# Patient Record
Sex: Female | Born: 2005 | Race: Black or African American | Hispanic: No | Marital: Single
Health system: Southern US, Community
[De-identification: ages and names within clinical notes are randomized; demographics above are authoritative.]

---
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
## 2023-09-03 NOTE — L&D Delivery Note (Signed)
 Kara Short is a 18 y.o. female G1P0 with IUP at [redacted]w[redacted]d admitted for IOL secondary to preeclampsia.  She progressed with augmentation to complete and pushed less than 30 min to deliver viable female.  Cord clamping delayed by 1-2 min then clamped by CNM and cut by her cousin.  Placenta intact and spontaneous, bleeding minimal.  2nd degree laceration repaired without difficulty.  Mom and baby stable prior to transfer to postpartum. She plans on breastfeeding.  Uncertain about birth control, favoring OCPs.  Delivery Note At 6:51 PM a viable and healthy female was delivered via Vaginal, Spontaneous (Presentation: Left Occiput Anterior), loose body cord reduced easily.  APGAR: 9, 9; weight pending.   Placenta status: Spontaneous, Intact.  Cord: 3 vessels with the following complications: None.    Anesthesia: Epidural Episiotomy: None Lacerations: 2nd degree Suture Repair: 3.0 vicryl Est. Blood Loss (mL): 182  Mom to postpartum.  Baby to Couplet care / Skin to Skin.  Lani N Karim-Rhoades 06/19/2024, 7:22 PM

## 2023-10-30 ENCOUNTER — Ambulatory Visit (HOSPITAL_COMMUNITY)
Admission: EM | Admit: 2023-10-30 | Discharge: 2023-10-30 | Disposition: A | Discharge status: Home / Self Care | Payer: Medicaid Other | Attending: Family Medicine | Admitting: Family Medicine

## 2023-10-30 ENCOUNTER — Encounter (HOSPITAL_COMMUNITY): Payer: Self-pay

## 2023-10-30 DIAGNOSIS — Z3201 Encounter for pregnancy test, result positive: Secondary | ICD-10-CM | POA: Diagnosis not present

## 2023-10-30 LAB — POCT URINE PREGNANCY: Preg Test, Ur: POSITIVE — AB

## 2023-10-30 MED ORDER — PRENATAL COMPLETE 14-0.4 MG PO TABS: ORAL_TABLET | ORAL | 0 refills | Status: DC

## 2023-10-30 NOTE — ED Triage Notes (Signed)
 Pt states has had 2 positive home pregnancy test. Last [facility] 1/23. Denies sx's

## 2023-10-30 NOTE — ED Provider Notes (Signed)
  [facility] CARE CENTER   [id number] 10/30/23 Arrival Time: 1011  ASSESSMENT & PLAN:  1. Positive pregnancy test    Results for orders placed or performed during the hospital encounter of 10/30/23  POCT urine pregnancy   Collection Time: 10/30/23 11:29 AM  Result Value Ref Range   Preg Test, Ur Positive (A) Negative  Copy of test given. Without n/v/abd pain.  Meds ordered this encounter  Medications   Prenatal Vit-Fe Fumarate-FA (PRENATAL COMPLETE) 14-0.4 MG TABS    Sig: Take one tablet by mouth once daily.    Dispense:  90 tablet    Refill:  0     Follow-up Information     Schedule an appointment as soon as possible for a visit  with Center for [facility] at [facility].   Specialty: Obstetrics and Gynecology Contact information: [address] [place] [address] [phone]                Reviewed expectations re: course of current medical issues. Questions answered. Outlined signs and symptoms indicating need for more acute intervention. Understanding verbalized. After Visit Summary given.   SUBJECTIVE: History from: Patient. Kara Short is a 18 y.o. female. Pt states has had 2 positive home pregnancy test. Last [facility] 1/23. Denies sx's Normal PO intake without n/v/d.  OBJECTIVE:  Vitals:   10/30/23 1119  BP: 111/69  Pulse: 86  Resp: 18  Temp: 98.4 F (36.9 C)  TempSrc: Oral  SpO2: 98%    General appearance: alert; no distress Abd: benign Extremities: no edema Skin: warm and dry Neurologic: normal gait Psychological: alert and cooperative; normal mood and affect  Labs: Results for orders placed or performed during the hospital encounter of 10/30/23  POCT urine pregnancy   Collection Time: 10/30/23 11:29 AM  Result Value Ref Range   Preg Test, Ur Positive (A) Negative   Labs Reviewed  POCT URINE PREGNANCY - Abnormal; Notable for the following components:      Result Value   Preg Test, Ur  Positive (*)    All other components within normal limits    Imaging: No results found.  No Known Allergies  History reviewed. No pertinent past medical history. Social History   Socioeconomic History   Marital status: Single    Spouse name: Not on file   Number of children: Not on file   Years of education: Not on file   Highest education level: Not on file  Occupational History   Not on file  Tobacco Use   Smoking status: Passive Smoke Exposure - Never Smoker   Smokeless tobacco: Never  Vaping Use   Vaping status: Never Used  Substance and Sexual Activity   Alcohol use: Not Currently   Drug use: Not Currently   Sexual activity: Not on file  Other Topics Concern   Not on file  Social History Narrative   Not on file   Social Drivers of Health   Financial Resource Strain: Not on file  Food Insecurity: Not on file  Transportation Needs: Not on file  Physical Activity: Not on file  Stress: Not on file  Social Connections: Not on file  Intimate Partner Violence: Not on file   History reviewed. No pertinent family history. History reviewed. No pertinent surgical history.   Mardella Layman, MD 10/30/23 1320

## 2023-12-09 ENCOUNTER — Telehealth

## 2023-12-09 DIAGNOSIS — Z3403 Encounter for supervision of normal first pregnancy, third trimester: Secondary | ICD-10-CM | POA: Insufficient documentation

## 2023-12-09 DIAGNOSIS — Z3689 Encounter for other specified antenatal screening: Secondary | ICD-10-CM

## 2023-12-09 DIAGNOSIS — Z3401 Encounter for supervision of normal first pregnancy, first trimester: Secondary | ICD-10-CM | POA: Insufficient documentation

## 2023-12-09 MED ORDER — BLOOD PRESSURE MONITORING DEVI: 1.0000 | 0 refills | Status: DC

## 2023-12-09 NOTE — Patient Instructions (Signed)

## 2023-12-09 NOTE — Progress Notes (Signed)
 New OB Intake  I connected with Kara Short  on 12/09/23 at 10:15 AM EDT by MyChart Video Visit and verified that I am speaking with the correct person using two identifiers. Nurse is located at [facility] and pt is located at home.  I discussed the limitations, risks, security and privacy concerns of performing an evaluation and management service by telephone and the availability of in person appointments. I also discussed with the patient that there may be a patient responsible charge related to this service. The patient expressed understanding and agreed to proceed.  I explained I am completing New OB Intake today. We discussed EDD of Not found.. Pt is G2P0. I reviewed her allergies, medications and Medical/Surgical/OB history.    There are no active problems to display for this patient.   Concerns addressed today  Delivery Plans Plans to deliver at [facility]. Discussed the nature of our practice with multiple providers including residents and students. Due to the size of the practice, the delivering provider may not be the same as those providing prenatal care.   Patient is not interested in water birth. Offered upcoming OB visit with CNM to discuss further.  MyChart/Babyscripts MyChart access verified. I explained pt will have some visits in office and some virtually. Babyscripts instructions given and order placed. Patient verifies receipt of registration text/e-mail. Account successfully created and app downloaded. If patient is a candidate for Optimized scheduling, add to sticky note.   Blood Pressure Cuff/Weight Scale Blood pressure cuff ordered for patient to pick-up from [employer]. Explained after first prenatal appt pt will check weekly and document in Babyscripts. Patient does have weight scale.  Anatomy US Explained first scheduled [place] will be around 19 weeks. Anatomy US scheduled for 02/05/24 at 0100p.  Is patient a CenteringPregnancy candidate?  Accepted   If  accepted,    Is patient a Mom+Baby Combined Care candidate?     If accepted, confirm patient does not intend to move from the area for at least 12 months, then notify Mom+Baby staff  Interested in [place]? If yes, send referral and doula dot phrase.   Is patient a candidate for Babyscripts Optimization?    First visit review I reviewed new OB appt with patient. Explained pt will be seen by Joellyn Quails at first visit. Discussed Avelina Laine genetic screening with patient. Needs Panorama and Horizon.. Routine prenatal labs  needed at [facility] OB visit.    Last Pap No results found for: "DIAGPAP"  Henrietta Dine, CMA 12/09/2023  10:20 AM

## 2023-12-14 NOTE — Progress Notes (Unsigned)
 PRENATAL VISIT NOTE  Subjective:  Kara Short is a 18 y.o. G1P0 at [redacted]w[redacted]d being seen today for her first prenatal visit for this pregnancy.  She is currently monitored for the following issues for this low-risk pregnancy and has Encounter for supervision of normal first pregnancy in first trimester on their problem list.  Patient reports  no concerns.  Patient denies vaginal bleeding, contractions, leakage of fluid.   She is planning to breat and bottle feed. Desires unsure for contraception.   The following portions of the patient's history were reviewed and updated as appropriate: allergies, current medications, past family history, past medical history, past social history, past surgical history and problem list.   Objective:   Vitals:   12/15/23 1447  BP: 129/73  Pulse: 78  Weight: 123 lb 3.2 oz (55.9 kg)    Fetal Status: Fetal Heart Rate (bpm): 166         General:  Alert, oriented and cooperative. Patient is in no acute distress.  Skin: Skin is warm and dry. No rash noted.   Cardiovascular: Normal heart rate and rhythm noted  Respiratory: Normal respiratory effort, no problems with respiration noted. Clear to auscultation.   Abdomen: Soft, gravid, appropriate for gestational age. Normal bowel sounds. Non-tender. Pain/Pressure: Absent     Pelvic: Cervical exam deferred       Normal cervical contour, no lesions, no bleeding following pap, normal discharge  Extremities: Normal range of motion.     Mental Status: Normal mood and affect. Normal behavior. Normal judgment and thought content.    Indications for ASA therapy (per uptodate) One of the following: Previous pregnancy with preeclampsia, especially early onset and with an adverse outcome No Multifetal gestation No Chronic hypertension No Type 1 or 2 diabetes mellitus No Chronic kidney disease No Autoimmune disease (antiphospholipid syndrome, systemic lupus erythematosus) No  Two or more of the  following: Nulliparity Yes Obesity (body mass index >30 kg/m2) No Family history of preeclampsia in mother or sister No Age >=35 years No Sociodemographic characteristics (African American race, low socioeconomic level) Yes Personal risk factors (eg, previous pregnancy with low birth weight or small for gestational age infant, previous adverse pregnancy outcome [eg, stillbirth], interval >10 years between pregnancies) No  Assessment and Plan:  Pregnancy: G1P0 at [redacted]w[redacted]d by LMP  1. Encounter for supervision of normal first pregnancy in first trimester (Primary) Initial labs drawn. Continue prenatal vitamins. Genetic Screening discussed: NIPS, carrier screening and AFP  Ultrasound discussed; fetal anatomic survey: 02/05/24 Problem list reviewed and updated. Reviewed Brx optimized schedule, patient agreeable The nature of [facility] with multiple MDs and other Advanced Practice Providers was explained to patient; also emphasized that residents, students are part of our team. Routine obstetric precautions reviewed.  - CBC/D/Plt+RPR+Rh+ABO+RubIgG... - Hemoglobin A1c - Culture, OB Urine - PANORAMA PRENATAL TEST - HORIZON CUSTOM - GC/Chlamydia probe amp ([place])not at [facility]  2. [redacted] weeks gestation of pregnancy Anticipatory guidance about next visits/weeks of pregnancy given.   Preterm labor/first trimester warning symptoms and general obstetric precautions including but not limited to vaginal bleeding, contractions, leaking of fluid and fetal movement were reviewed in detail with the patient.  Please refer to After Visit Summary for other counseling recommendations.   Return in about 4 weeks (around 01/12/2024).  Future Appointments  Date Time Provider Department Center  01/13/2024  2:15 PM Curlie Doughty [facility]  02/03/2024  8:00 AM [facility] NURSE INTAKE [facility]  02/05/2024  1:00 PM [facility] PROVIDER 1 [facility]  02/05/2024  1:30 PM  [facility] US3 [facility]    Luevenia Saha, PA-C

## 2023-12-15 ENCOUNTER — Ambulatory Visit: Admitting: Physician Assistant

## 2023-12-15 ENCOUNTER — Other Ambulatory Visit (HOSPITAL_COMMUNITY)
Admission: RE | Admit: 2023-12-15 | Discharge: 2023-12-15 | Disposition: A | Discharge status: Home / Self Care | Source: Ambulatory Visit | Attending: Physician Assistant | Admitting: Physician Assistant

## 2023-12-15 ENCOUNTER — Encounter: Payer: Self-pay | Admitting: Family Medicine

## 2023-12-15 ENCOUNTER — Other Ambulatory Visit: Payer: Self-pay

## 2023-12-15 ENCOUNTER — Encounter: Payer: Self-pay | Admitting: Physician Assistant

## 2023-12-15 VITALS — BP 129/73 | HR 78 | Wt 123.2 lb

## 2023-12-15 DIAGNOSIS — Z3401 Encounter for supervision of normal first pregnancy, first trimester: Secondary | ICD-10-CM | POA: Insufficient documentation

## 2023-12-15 DIAGNOSIS — Z3A11 11 weeks gestation of pregnancy: Secondary | ICD-10-CM

## 2023-12-15 DIAGNOSIS — Z31430 Encounter of female for testing for genetic disease carrier status for procreative management: Secondary | ICD-10-CM | POA: Diagnosis not present

## 2023-12-15 MED ORDER — ASPIRIN 81 MG PO TBEC: 81.0000 mg | DELAYED_RELEASE_TABLET | Freq: Every day | ORAL | 12 refills | Status: DC

## 2023-12-16 LAB — CBC/D/PLT+RPR+RH+ABO+RUBIGG...
Antibody Screen: NEGATIVE
Basophils Absolute: 0 10*3/uL (ref 0.0–0.2)
Basos: 0 %
EOS (ABSOLUTE): 0 10*3/uL (ref 0.0–0.4)
Eos: 1 %
HCV Ab: NONREACTIVE
HIV Screen 4th Generation wRfx: NONREACTIVE
Hematocrit: 33.9 % — ABNORMAL LOW (ref 34.0–46.6)
Hemoglobin: 11.3 g/dL (ref 11.1–15.9)
Hepatitis B Surface Ag: NEGATIVE
Immature Grans (Abs): 0 10*3/uL (ref 0.0–0.1)
Immature Granulocytes: 0 %
Lymphocytes Absolute: 1.9 10*3/uL (ref 0.7–3.1)
Lymphs: 33 %
MCH: 29 pg (ref 26.6–33.0)
MCHC: 33.3 g/dL (ref 31.5–35.7)
MCV: 87 fL (ref 79–97)
Monocytes Absolute: 0.5 10*3/uL (ref 0.1–0.9)
Monocytes: 9 %
Neutrophils Absolute: 3.4 10*3/uL (ref 1.4–7.0)
Neutrophils: 57 %
Platelets: 276 10*3/uL (ref 150–450)
RBC: 3.89 x10E6/uL (ref 3.77–5.28)
RDW: 14.1 % (ref 11.7–15.4)
RPR Ser Ql: NONREACTIVE
Rh Factor: POSITIVE
Rubella Antibodies, IGG: 3.66 {index} (ref >0.99)
WBC: 6 10*3/uL (ref 3.4–10.8)

## 2023-12-16 LAB — HEMOGLOBIN A1C
Est. average glucose Bld gHb Est-mCnc: 103 mg/dL
Hgb A1c MFr Bld: 5.2 % (ref 4.8–5.6)

## 2023-12-16 LAB — HCV INTERPRETATION

## 2023-12-17 ENCOUNTER — Encounter: Payer: Self-pay | Admitting: Physician Assistant

## 2023-12-17 LAB — CULTURE, OB URINE

## 2023-12-17 LAB — GC/CHLAMYDIA PROBE AMP (~~LOC~~) NOT AT ARMC
Chlamydia: POSITIVE — AB
Comment: NEGATIVE
Comment: NORMAL
Neisseria Gonorrhea: NEGATIVE

## 2023-12-17 LAB — URINE CULTURE, OB REFLEX

## 2023-12-18 ENCOUNTER — Encounter: Payer: Self-pay | Admitting: Physician Assistant

## 2023-12-18 ENCOUNTER — Other Ambulatory Visit: Payer: Self-pay

## 2023-12-18 DIAGNOSIS — A749 Chlamydial infection, unspecified: Secondary | ICD-10-CM

## 2023-12-18 MED ORDER — AZITHROMYCIN 250 MG PO TABS: 1000.0000 mg | ORAL_TABLET | Freq: Once | ORAL | 0 refills | Status: AC

## 2023-12-18 NOTE — Telephone Encounter (Signed)
 Treatment sent to pharmacy

## 2023-12-23 LAB — PANORAMA PRENATAL TEST FULL PANEL:PANORAMA TEST PLUS 5 ADDITIONAL MICRODELETIONS: FETAL FRACTION: 11.4

## 2023-12-25 ENCOUNTER — Encounter: Payer: Self-pay | Admitting: Physician Assistant

## 2023-12-25 DIAGNOSIS — A568 Sexually transmitted chlamydial infection of other sites: Secondary | ICD-10-CM | POA: Insufficient documentation

## 2023-12-25 DIAGNOSIS — O98311 Other infections with a predominantly sexual mode of transmission complicating pregnancy, first trimester: Secondary | ICD-10-CM | POA: Insufficient documentation

## 2023-12-25 DIAGNOSIS — Z148 Genetic carrier of other disease: Secondary | ICD-10-CM | POA: Insufficient documentation

## 2023-12-25 LAB — HORIZON CUSTOM: REPORT SUMMARY: POSITIVE — AB

## 2024-01-04 ENCOUNTER — Encounter: Payer: Self-pay | Admitting: Physician Assistant

## 2024-01-12 NOTE — Progress Notes (Unsigned)
   PRENATAL VISIT NOTE  Subjective:  Kara Short is a 18 y.o. G1P0 at [redacted]w[redacted]d being seen today for ongoing prenatal care.  She is currently monitored for the following issues for this {Blank single:19197::"high-risk","low-risk"} pregnancy and has Encounter for supervision of normal first pregnancy in first trimester; Carrier of spinal muscular atrophy; and Chlamydia trachomatis infection in pregnancy in first trimester on their problem list.  Patient reports {sx:14538}.   .  .   . Denies leaking of fluid.   The following portions of the patient's history were reviewed and updated as appropriate: allergies, current medications, past family history, past medical history, past social history, past surgical history and problem list.   Objective:  There were no vitals filed for this visit.   Fetal Status:            General: Alert, oriented and cooperative. Patient is in no acute distress.  Skin: Skin is warm and dry. No rash noted.   Cardiovascular: Normal heart rate noted  Respiratory: Normal respiratory effort, no problems with respiration noted  Abdomen: Soft, gravid, appropriate for gestational age.        Pelvic: {Blank single:19197::"Cervical exam performed in the presence of a chaperone","Cervical exam deferred"}        Extremities: Normal range of motion.     Mental Status: Normal mood and affect. Normal behavior. Normal judgment and thought content.   Assessment and Plan:  Pregnancy: G1P0 at [redacted]w[redacted]d 1. Encounter for supervision of normal first pregnancy in first trimester (Primary) ***  2. [redacted] weeks gestation of pregnancy ***  {Blank single:19197::"Term","Preterm"} labor symptoms and general obstetric precautions including but not limited to vaginal bleeding, contractions, leaking of fluid and fetal movement were reviewed in detail with the patient. Please refer to After Visit Summary for other counseling recommendations.   No follow-ups on file.  Future Appointments  Date  Time Provider Department Center  01/13/2024  2:15 PM Curlie Doughty [facility]  01/30/2024  9:00 AM CENTERING PROVIDER [facility]  02/05/2024  1:00 PM [facility] PROVIDER 1 [facility]  02/05/2024  1:30 PM [facility] US3 [facility]  02/27/2024  9:00 AM CENTERING PROVIDER [facility]  03/26/2024  9:00 AM CENTERING PROVIDER [facility]  04/09/2024  9:00 AM CENTERING PROVIDER [facility]  04/23/2024  9:00 AM CENTERING PROVIDER [facility]  05/07/2024  9:00 AM CENTERING PROVIDER [facility]  05/21/2024  9:00 AM CENTERING PROVIDER [facility]  06/04/2024  9:00 AM CENTERING PROVIDER [facility]  06/11/2024  9:00 AM CENTERING PROVIDER [facility]  06/18/2024  9:00 AM CENTERING PROVIDER [facility]    Salomon Cree, CNM

## 2024-01-13 ENCOUNTER — Ambulatory Visit (INDEPENDENT_AMBULATORY_CARE_PROVIDER_SITE_OTHER): Admitting: Certified Nurse Midwife

## 2024-01-13 ENCOUNTER — Other Ambulatory Visit: Payer: Self-pay

## 2024-01-13 VITALS — BP 109/70 | HR 88 | Wt 129.6 lb

## 2024-01-13 DIAGNOSIS — Z3401 Encounter for supervision of normal first pregnancy, first trimester: Secondary | ICD-10-CM | POA: Diagnosis not present

## 2024-01-13 DIAGNOSIS — Z3A15 15 weeks gestation of pregnancy: Secondary | ICD-10-CM

## 2024-01-13 DIAGNOSIS — Z3402 Encounter for supervision of normal first pregnancy, second trimester: Secondary | ICD-10-CM | POA: Diagnosis not present

## 2024-01-13 MED ORDER — PREPLUS 27-1 MG PO TABS: 1.0000 | ORAL_TABLET | Freq: Every day | ORAL | 13 refills | Status: DC

## 2024-01-15 LAB — AFP, SERUM, OPEN SPINA BIFIDA
AFP MoM: 1.15
AFP Value: 40.2 ng/mL
Gest. Age on Collection Date: 15 wk
Maternal Age At EDD: 20 a
OSBR Risk 1 IN: 10000
Test Results:: NEGATIVE
Weight: 130 [lb_av]

## 2024-01-17 ENCOUNTER — Ambulatory Visit: Payer: Self-pay | Admitting: Certified Nurse Midwife

## 2024-01-17 NOTE — Progress Notes (Signed)
 Patient with normal AFP results. MyChart message sent.  Raford Bunk, MSN, CNM, RNC-OB Certified Nurse Midwife, [facility] 01/17/2024 3:11 PM

## 2024-01-21 ENCOUNTER — Encounter: Payer: Self-pay | Admitting: *Deleted

## 2024-01-21 ENCOUNTER — Telehealth: Payer: Self-pay | Admitting: *Deleted

## 2024-01-21 NOTE — Telephone Encounter (Signed)
 I called Kara Short and left a message I am calling about her upcoming appointment and will send a detailed MyChart message for you to read and respond . [name]

## 2024-01-30 ENCOUNTER — Encounter: Payer: Self-pay | Admitting: *Deleted

## 2024-01-30 ENCOUNTER — Encounter: Admitting: Family

## 2024-02-03 ENCOUNTER — Ambulatory Visit

## 2024-02-04 ENCOUNTER — Ambulatory Visit: Admitting: *Deleted

## 2024-02-04 VITALS — BP 108/58 | HR 69 | Wt 129.9 lb

## 2024-02-04 DIAGNOSIS — Z3401 Encounter for supervision of normal first pregnancy, first trimester: Secondary | ICD-10-CM

## 2024-02-04 NOTE — Progress Notes (Signed)
 Here for nurse visit since she missed centering prenatal visit. She states she is doing well and denies any concerns. BP and FHR wnl. Reviewed next ob appointment date/ time. She voices understanding. [name]

## 2024-02-05 ENCOUNTER — Ambulatory Visit: Attending: Physician Assistant

## 2024-02-05 ENCOUNTER — Ambulatory Visit (HOSPITAL_BASED_OUTPATIENT_CLINIC_OR_DEPARTMENT_OTHER): Admitting: Maternal & Fetal Medicine

## 2024-02-05 ENCOUNTER — Other Ambulatory Visit: Payer: Self-pay | Admitting: Physician Assistant

## 2024-02-05 VITALS — BP 118/67 | HR 78 | Ht 61.0 in

## 2024-02-05 DIAGNOSIS — Z148 Genetic carrier of other disease: Secondary | ICD-10-CM | POA: Insufficient documentation

## 2024-02-05 DIAGNOSIS — Z3401 Encounter for supervision of normal first pregnancy, first trimester: Secondary | ICD-10-CM | POA: Insufficient documentation

## 2024-02-05 DIAGNOSIS — Z363 Encounter for antenatal screening for malformations: Secondary | ICD-10-CM | POA: Insufficient documentation

## 2024-02-05 DIAGNOSIS — Z3687 Encounter for antenatal screening for uncertain dates: Secondary | ICD-10-CM | POA: Insufficient documentation

## 2024-02-05 DIAGNOSIS — Z3A19 19 weeks gestation of pregnancy: Secondary | ICD-10-CM | POA: Insufficient documentation

## 2024-02-05 NOTE — Progress Notes (Signed)
 After review, MFM consult with provider is not indicated for today  Kristi Petties, MD 02/05/2024 3:06 PM  [facility]

## 2024-02-06 ENCOUNTER — Ambulatory Visit: Attending: Maternal & Fetal Medicine

## 2024-02-27 ENCOUNTER — Ambulatory Visit (INDEPENDENT_AMBULATORY_CARE_PROVIDER_SITE_OTHER): Payer: Self-pay | Admitting: Family

## 2024-02-27 VITALS — BP 114/78 | HR 73 | Wt 139.6 lb

## 2024-02-27 DIAGNOSIS — Z3401 Encounter for supervision of normal first pregnancy, first trimester: Secondary | ICD-10-CM | POA: Diagnosis not present

## 2024-02-27 DIAGNOSIS — Z3A23 23 weeks gestation of pregnancy: Secondary | ICD-10-CM

## 2024-02-27 DIAGNOSIS — Z1379 Encounter for other screening for genetic and chromosomal anomalies: Secondary | ICD-10-CM | POA: Insufficient documentation

## 2024-02-27 NOTE — Patient Instructions (Signed)

## 2024-02-27 NOTE — Progress Notes (Signed)
  [facility] PRENATAL VIS[facility] NOTE- Centering Pregnancy Group #21  Subjective:  Kara Short is a 18 y.o. G1P0 at [redacted]w[redacted]d being seen today for ongoing prenatal care through Centering Pregnancy.  Here with FOB Zack.  She is currently monitored for the following issues for this low-risk pregnancy and has Encounter for supervision of normal first pregnancy in first trimester; Carrier of spinal muscular atrophy; Chlamydia trachomatis infection in pregnancy in first trimester; and Encounter for genetic screening on their problem list.  Patient reports no complaints.   . Vag. Bleeding: None.  Movement: Present. Denies leaking of fluid/ROM.   The following portions of the patient's history were reviewed and updated as appropriate: allergies, current medications, past family history, past medical history, past social history, past surgical history and problem list. Problem list updated.  Objective:   Today's Vitals   02/27/24 0913  BP: 114/78  Pulse: 73  Weight: 139 lb 9.6 oz (63.3 kg)   Body mass index is 26.38 kg/m.   Fetal Status: Fetal Heart Rate (bpm): 128 Fundal Height: 24 cm Movement: Present     General:  Alert, oriented and cooperative. Patient is in no acute distress.  Skin: Skin is warm and dry. No rash noted.   Cardiovascular: Normal heart rate noted  Respiratory: Normal respiratory effort, no problems with respiration noted  Abdomen: Soft, gravid, appropriate for gestational age.  Pain/Pressure: Absent     Pelvic: Cervical exam deferred        Extremities: Normal range of motion.     Mental Status: Normal mood and affect. Normal behavior. Normal judgment and thought content.   Assessment and Plan:  Pregnancy: G1P0 at [redacted]w[redacted]d  1. Encounter for genetic screening (Primary) - AFP, Serum, Open Spina Bifida  2. Encounter for supervision of normal first pregnancy in first trimester - Reviewed US  results  Preterm labor symptoms and general obstetric  precautions including but not limited to vaginal bleeding, contractions, leaking of fluid and fetal movement were reviewed in detail with the patient. Please refer to After Visit Summary for other counseling recommendations.    Centering Pregnancy, Session#2:  Introduced new members. Reviewed rules for self-governance and facilitated approach with group. Directed group to mother's notebook. Toured Stage manager and discussed availability of food resources.   Facilitated discussion today:  Nutrition, Genetic screening options with AFP/Quad.   Fundal height and FHR appropriate today unless noted otherwise in plan. Patient to continue group care.     Return in about 4 weeks (around 03/26/2024) for Centering Group (Scheduled).  Future Appointments  Date Time Provider Department Center  03/26/2024  9:00 AM CENTERING PROVIDER [facility]  04/09/2024  9:00 AM CENTERING PROVIDER [facility]  04/23/2024  9:00 AM CENTERING PROVIDER [facility]  05/07/2024  9:00 AM CENTERING PROVIDER [facility]  05/21/2024  9:00 AM CENTERING PROVIDER [facility]  06/04/2024  9:00 AM CENTERING PROVIDER [facility]  06/11/2024  9:00 AM CENTERING PROVIDER [facility]  06/18/2024  9:00 AM CENTERING PROVIDER [facility]    Tjopijy LOISE Pouch, CNM

## 2024-02-29 LAB — AFP, SERUM, OPEN SPINA BIFIDA
AFP MoM: 1.29
AFP Value: 126.6 ng/mL
Gest. Age on Collection Date: 23 wk
Maternal Age At EDD: 20 a
OSBR Risk 1 IN: 9894
Test Results:: NEGATIVE
Weight: 139 [lb_av]

## 2024-03-26 ENCOUNTER — Encounter: Payer: Self-pay | Admitting: *Deleted

## 2024-03-26 ENCOUNTER — Other Ambulatory Visit (HOSPITAL_COMMUNITY)
Admission: RE | Admit: 2024-03-26 | Discharge: 2024-03-26 | Disposition: A | Discharge status: Home / Self Care | Source: Ambulatory Visit | Attending: Family | Admitting: Family

## 2024-03-26 ENCOUNTER — Ambulatory Visit (INDEPENDENT_AMBULATORY_CARE_PROVIDER_SITE_OTHER): Payer: Self-pay | Admitting: Family

## 2024-03-26 VITALS — BP 113/72 | HR 88 | Wt 142.0 lb

## 2024-03-26 DIAGNOSIS — Z1332 Encounter for screening for maternal depression: Secondary | ICD-10-CM

## 2024-03-26 DIAGNOSIS — Z3A27 27 weeks gestation of pregnancy: Secondary | ICD-10-CM

## 2024-03-26 DIAGNOSIS — A749 Chlamydial infection, unspecified: Secondary | ICD-10-CM | POA: Insufficient documentation

## 2024-03-26 DIAGNOSIS — O98812 Other maternal infectious and parasitic diseases complicating pregnancy, second trimester: Secondary | ICD-10-CM

## 2024-03-26 DIAGNOSIS — O98813 Other maternal infectious and parasitic diseases complicating pregnancy, third trimester: Secondary | ICD-10-CM

## 2024-03-26 NOTE — Progress Notes (Signed)
  [facility] PRENATAL VISIT NOTE- Centering Pregnancy Group #21  Subjective:  Kara Short is a 18 y.o. G1P0 at [redacted]w[redacted]d being seen today for ongoing prenatal care through Centering Pregnancy.  She is currently monitored for the following issues for this low-risk pregnancy and has Encounter for supervision of normal first pregnancy in first trimester; Carrier of spinal muscular atrophy; Chlamydia trachomatis infection in pregnancy in first trimester; and Encounter for genetic screening on their problem list.  Patient reports no complaints.  Movement: Present. Denies leaking of fluid/ROM.   The following portions of the patient's history were reviewed and updated as appropriate: allergies, current medications, past family history, past medical history, past social history, past surgical history and problem list. Problem list updated.  Objective:   Vitals:   03/26/24 0902  BP: 113/72  Pulse: 88    Fetal Status: Fetal Heart Rate (bpm): 155 Fundal Height: 27 cm Movement: Present     General:  Alert, oriented and cooperative. Patient is in no acute distress.  Skin: Skin is warm and dry. No rash noted.   Cardiovascular: Normal heart rate noted  Respiratory: Normal respiratory effort, no problems with respiration noted  Abdomen: Soft, gravid, appropriate for gestational age.        Pelvic: Cervical exam deferred        Extremities: Normal range of motion.     Mental Status: Normal mood and affect. Normal behavior. Normal judgment and thought content.   Assessment and Plan:  Pregnancy: G1P0 at [redacted]w[redacted]d  1. Chlamydia infection affecting pregnancy in third trimester (Primary) - Cervicovaginal ancillary only( [place])  2. Supervision of Normal Pregnancy - Reviewed results of AFP (neg) - Will schedule or complete 2 hr   Preterm labor symptoms and general obstetric precautions including but not limited to vaginal bleeding, contractions, leaking of fluid and fetal  movement were reviewed in detail with the patient. Please refer to After Visit Summary for other counseling recommendations.    Return in about 2 weeks (around 04/09/2024) for Centering Group (Scheduled).  Future Appointments  Date Time Provider Department Center  04/09/2024  9:00 AM CENTERING PROVIDER [facility]  04/23/2024  9:00 AM CENTERING PROVIDER [facility]  05/07/2024  9:00 AM CENTERING PROVIDER [facility]  05/21/2024  9:00 AM CENTERING PROVIDER [facility]  06/04/2024  9:00 AM CENTERING PROVIDER [facility]  06/11/2024  9:00 AM CENTERING PROVIDER [facility]  06/18/2024  9:00 AM CENTERING PROVIDER [facility]    Tjopijy LOISE Pouch, CNM

## 2024-03-29 ENCOUNTER — Other Ambulatory Visit: Payer: Self-pay | Admitting: *Deleted

## 2024-03-29 ENCOUNTER — Other Ambulatory Visit: Payer: Self-pay

## 2024-03-29 DIAGNOSIS — O99340 Other mental disorders complicating pregnancy, unspecified trimester: Secondary | ICD-10-CM

## 2024-03-29 DIAGNOSIS — Z3401 Encounter for supervision of normal first pregnancy, first trimester: Secondary | ICD-10-CM

## 2024-03-29 LAB — CERVICOVAGINAL ANCILLARY ONLY
Chlamydia: NEGATIVE
Comment: NEGATIVE
Comment: NORMAL
Neisseria Gonorrhea: NEGATIVE

## 2024-03-30 ENCOUNTER — Ambulatory Visit: Payer: Self-pay | Admitting: Family

## 2024-03-30 ENCOUNTER — Other Ambulatory Visit: Payer: Self-pay

## 2024-03-30 ENCOUNTER — Other Ambulatory Visit

## 2024-03-30 DIAGNOSIS — Z3401 Encounter for supervision of normal first pregnancy, first trimester: Secondary | ICD-10-CM | POA: Diagnosis not present

## 2024-03-31 LAB — CBC
Hematocrit: 31.3 % — ABNORMAL LOW (ref 34.0–46.6)
Hemoglobin: 10 g/dL — ABNORMAL LOW (ref 11.1–15.9)
MCH: 29.3 pg (ref 26.6–33.0)
MCHC: 31.9 g/dL (ref 31.5–35.7)
MCV: 92 fL (ref 79–97)
Platelets: 216 x10E3/uL (ref 150–450)
RBC: 3.41 x10E6/uL — ABNORMAL LOW (ref 3.77–5.28)
RDW: 12.7 % (ref 11.7–15.4)
WBC: 7.3 x10E3/uL (ref 3.4–10.8)

## 2024-03-31 LAB — GLUCOSE TOLERANCE, 2 HOURS W/ 1HR
Glucose, 1 hour: 133 mg/dL (ref 70–179)
Glucose, 2 hour: 115 mg/dL (ref 70–152)
Glucose, Fasting: 71 mg/dL (ref 70–91)

## 2024-03-31 LAB — RPR: RPR Ser Ql: NONREACTIVE

## 2024-03-31 LAB — HIV ANTIBODY (ROUTINE TESTING W REFLEX): HIV Screen 4th Generation wRfx: NONREACTIVE

## 2024-04-01 ENCOUNTER — Encounter: Payer: Self-pay | Admitting: *Deleted

## 2024-04-02 ENCOUNTER — Telehealth: Payer: Self-pay | Admitting: Clinical

## 2024-04-02 NOTE — Telephone Encounter (Signed)
Attempt call regarding referral; Left HIPPA-compliant message to call back Mikle Sternberg from [facility] at  [phone] (Ashelyn Mccravy's office).    

## 2024-04-08 NOTE — BH Specialist Note (Signed)
 Integrated Behavioral Health via Telemedicine Visit  04/12/2024 Kara Short [id number]  Number of Integrated Behavioral Health Clinician visits: 1- Initial Visit  Session Start time: 0845   Session End time: 0917  Total time in minutes: 32  Referring Provider: Norleen Rover, MD Patient/Family location: Home [facility] Provider location: Center for [facility]  All persons participating in visit: Patient Kara Short and [facility] Tarik Teixeira    Types of Service: Individual psychotherapy and Video visit  I connected with Kara Short and/or Kara Short's n/a via  Telephone or Video Enabled Telemedicine Application  (Video is Caregility application) and verified that I am speaking with the correct person using two identifiers. Discussed confidentiality: Yes   I discussed the limitations of telemedicine and the availability of in person appointments.  Discussed there is a possibility of technology failure and discussed alternative modes of communication if that failure occurs.  I discussed that engaging in this telemedicine visit, they consent to the provision of behavioral healthcare and the services will be billed under their insurance.  Patient and/or legal guardian expressed understanding and consented to Telemedicine visit: Yes   Presenting Concerns: Patient and/or family reports the following symptoms/concerns: Anxious, low motivation, depression, oversleeping, fatigue; attributes increased anxiety to current first pregnancy and less support than expected from FOB; pt has good support at home; requests to have pregnancy navigator contact her; goal is to prepare for baby's arrival. Pt has not started taking iron, as pharmacy said it was $16, even with her Medicaid.  Duration of problem: Increase current pregnancy; Severity of problem: moderate  Patient and/or Family's Strengths/Protective Factors: Social connections, Concrete  supports in place (healthy food, safe environments, etc.), Sense of purpose, and Physical Health (exercise, healthy diet, medication compliance, etc.)  Goals Addressed: Patient will:  Reduce symptoms of: anxiety and depression   Increase knowledge and/or ability of: healthy habits   Demonstrate ability to: Increase healthy adjustment to current life circumstances  Progress towards Goals: Ongoing    Interventions: Interventions utilized:  Psychoeducation and/or Health Education, Link to [employer], and Supportive Reflection Standardized Assessments completed: Not Needed   Patient and/or Family Response: Patient agrees with treatment plan.   Clinical Assessment/Diagnosis  Adjustment disorder with mixed anxiety and depressed mood    Patient may benefit from psychoeducation and brief therapeutic interventions regarding coping with symptoms of anxiety and depression .  Plan: Follow up with behavioral health clinician on : One month Behavioral recommendations:  -Continue taking prenatal vitamin and iron as prescribed (consider different pharmacy, as discussed) -Continue prioritizing healthy self-care (regular meals, adequate rest; allowing practical help from supportive friends and family)  -Read through information on After Visit Summary; use as needed and discussed (consider registering for childbirth education class of choice)  Referral(s): Integrated Art gallery manager (In Clinic) and [employer]:  [email or web address]  I discussed the assessment and treatment plan with the patient and/or parent/guardian. They were provided an opportunity to ask questions and all were answered. They agreed with the plan and demonstrated an understanding of the instructions.   They were advised to call back or seek an in-person evaluation if the symptoms worsen or if the condition fails to improve as anticipated.  Warren JAYSON Mering, LCSW     03/26/2024   11:50 AM  12/15/2023    3:23 PM  Depression screen PHQ 2/9  Decreased Interest 2 2  Down, Depressed, Hopeless 2 0  PHQ - 2 Score 4 2  Altered sleeping 2  0  Tired, decreased energy 2 1  Change in appetite 1 3  Feeling bad or failure about yourself  1 0  Trouble concentrating 0 0  Moving slowly or fidgety/restless 0 0  Suicidal thoughts 0 0  PHQ-9 Score 10 6  Difficult doing work/chores  Not difficult at all      03/26/2024   11:51 AM 12/15/2023    3:24 PM  GAD 7 : Generalized Anxiety Score  Nervous, Anxious, on Edge 1 0  Control/stop worrying 0 0  Worry too much - different things 1 0  Trouble relaxing 1 0  Restless 0 0  Easily annoyed or irritable 1 1  Afraid - awful might happen 0 0  Total GAD 7 Score 4 1

## 2024-04-09 ENCOUNTER — Ambulatory Visit (INDEPENDENT_AMBULATORY_CARE_PROVIDER_SITE_OTHER): Payer: Self-pay | Admitting: Family

## 2024-04-09 VITALS — BP 109/73 | HR 87 | Wt 143.6 lb

## 2024-04-09 DIAGNOSIS — Z23 Encounter for immunization: Secondary | ICD-10-CM | POA: Diagnosis not present

## 2024-04-09 DIAGNOSIS — O99013 Anemia complicating pregnancy, third trimester: Secondary | ICD-10-CM

## 2024-04-09 DIAGNOSIS — F419 Anxiety disorder, unspecified: Secondary | ICD-10-CM

## 2024-04-09 DIAGNOSIS — Z3A29 29 weeks gestation of pregnancy: Secondary | ICD-10-CM

## 2024-04-09 DIAGNOSIS — Z3403 Encounter for supervision of normal first pregnancy, third trimester: Secondary | ICD-10-CM

## 2024-04-09 MED ORDER — FERROUS SULFATE 325 (65 FE) MG PO TBEC: 325.0000 mg | DELAYED_RELEASE_TABLET | Freq: Two times a day (BID) | ORAL | 3 refills | Status: DC

## 2024-04-09 NOTE — Addendum Note (Signed)
 Addended by: Genoveva Singleton L on: 04/09/2024 11:47 AM   Modules accepted: Orders

## 2024-04-09 NOTE — Progress Notes (Signed)
  [facility] PRENATAL VISIT NOTE- Centering Pregnancy Group #21  Subjective:  Kara Short is a 18 y.o. G1P0 at [redacted]w[redacted]d being seen today for ongoing prenatal care through Centering Pregnancy.  She is currently monitored for the following issues for this low-risk pregnancy and has Encounter for supervision of normal first pregnancy in third trimester; Carrier of spinal muscular atrophy; Chlamydia trachomatis infection in pregnancy in first trimester; and Encounter for genetic screening on their problem list.  Patient reports increased challenges with relationship with father of baby.  At times experiences anxiety.  Reports having support.  Also feels fatigue.   Movement: Present. Denies leaking of fluid/ROM.   The following portions of the patient's history were reviewed and updated as appropriate: allergies, current medications, past family history, past medical history, past social history, past surgical history and problem list. Problem list updated.  Objective:   Today's Vitals   04/09/24 0917  BP: 109/73  Pulse: 87  Weight: 143 lb 9.6 oz (65.1 kg)   Body mass index is 27.13 kg/m.  Fetal Status: Fetal Heart Rate (bpm): 150 Fundal Height: 29 cm Movement: Present     General:  Alert, oriented and cooperative. Patient is in no acute distress.  Skin: Skin is warm and dry. No rash noted.   Cardiovascular: Normal heart rate noted  Respiratory: Normal respiratory effort, no problems with respiration noted  Abdomen: Soft, gravid, appropriate for gestational age.        Pelvic: Cervical exam deferred        Extremities: Normal range of motion.     Mental Status: Normal mood and affect. Normal behavior. Normal judgment and thought content.   Assessment and Plan:  Pregnancy: G1P0 at [redacted]w[redacted]d  1. Anxiety (Primary) - Ambulatory referral to Integrated Behavioral Health  2. Anemia affecting pregnancy in third trimester - ferrous sulfate  325 (65 FE) MG EC tablet; Take 1  tablet (325 mg total) by mouth 2 (two) times daily.  Dispense: 30 tablet; Refill: 3  3. Encounter for supervision of normal first pregnancy in third trimester - Reviewed 28 wk results - TdaP received today   Preterm labor symptoms and general obstetric precautions including but not limited to vaginal bleeding, contractions, leaking of fluid and fetal movement were reviewed in detail with the patient. Please refer to After Visit Summary for other counseling recommendations.    Centering Pregnancy, Session#4:  Directed group to mother's notebook.   Facilitated discussion today:  Family Planning, TdAP and ASA Use in Pregnancy.   Fundal height and FHR appropriate today unless noted otherwise in plan. Patient to continue group care.     Return in about 2 weeks (around 04/23/2024) for Centering Group (Scheduled).  Future Appointments  Date Time Provider Department Center  04/12/2024  8:15 AM [facility] HEALTH CLINICIAN [facility]  04/23/2024  9:00 AM CENTERING PROVIDER [facility]  05/07/2024  9:00 AM CENTERING PROVIDER [facility]  05/21/2024  9:00 AM CENTERING PROVIDER [facility]  06/04/2024  9:00 AM CENTERING PROVIDER [facility]  06/11/2024  9:00 AM CENTERING PROVIDER [facility]  06/18/2024  9:00 AM CENTERING PROVIDER [facility]    Tjopijy LOISE Pouch, CNM

## 2024-04-09 NOTE — Patient Instructions (Signed)
 Family Planning Website [email or web address]

## 2024-04-12 ENCOUNTER — Ambulatory Visit (INDEPENDENT_AMBULATORY_CARE_PROVIDER_SITE_OTHER): Payer: Self-pay | Admitting: Clinical

## 2024-04-12 DIAGNOSIS — F4323 Adjustment disorder with mixed anxiety and depressed mood: Secondary | ICD-10-CM | POA: Diagnosis not present

## 2024-04-12 NOTE — Patient Instructions (Signed)
 [facility] for [facility] [address] [place], [place] [address] [phone] (main office) [phone] [facility] office)  [email or web address]     BRAINSTORMING  Develop a Plan Goals: Provide a way to start conversation about your new life with a baby Assist parents in recognizing and using resources within their reach Help pave the way before birth for an easier period of transition afterwards.  Make a list of the following information to keep in a central location: Full name of Mom and Partner: _____________________________________________ Baby's full name and Date of Birth: ___________________________________________ Home Address: ___________________________________________________________ ________________________________________________________________________ Home Phone: ____________________________________________________________ Parents' cell numbers: _____________________________________________________ ________________________________________________________________________ Name and contact info for OB: ______________________________________________ Name and contact info for Pediatrician:________________________________________ Contact info for Lactation Consultants: ________________________________________  REST and SLEEP *You each need at least 4-5 hours of uninterrupted sleep every day. Write specific names and contact information.* How are you going to rest in the postpartum period? While partner's home? When partner returns to work? When you both return to work? Where will your baby sleep? Who is available to help during the day? Evening? Night? Who could move in for a period to help support you? What are some ideas to help you get enough  sleep? __________________________________________________________________________________________________________________________________________________________________________________________________________________________________________ NUTRITIOUS FOOD AND DRINK *Plan for meals before your baby is born so you can have healthy food to eat during the immediate postpartum period.* Who will look after breakfast? Lunch? Dinner? List names and contact information. Brainstorm quick, healthy ideas for each meal. What can you do before baby is born to prepare meals for the postpartum period? How can others help you with meals? Which grocery stores provide online shopping and delivery? Which restaurants offer take-out or delivery options? ______________________________________________________________________________________________________________________________________________________________________________________________________________________________________________________________________________________________________________________________________________________________________________________________________  CARE FOR MOM *It's important that mom is cared for and pampered in the postpartum period. Remember, the most important ways new mothers need care are: sleep, nutrition, gentle exercise, and time off.* Who can come take care of mom during this period? Make a list of people with their contact information. List some activities that make you feel cared for, rested, and energized? Who can make sure you have opportunities to do these things? Does mom have a space of her very own within your home that's just for her? Make a "[facility]" where she can be comfortable, rest, and renew herself  daily. ______________________________________________________________________________________________________________________________________________________________________________________________________________________________________________________________________________________________________________________________________________________________________________________________________    CARE FOR AND FEEDING BABY *Knowledgeable and encouraging people will offer the best support with regard to feeding your baby.* Educate yourself and choose the best feeding option for your baby. Make a list of people who will guide, support, and be a resource for you as your care for and feed your baby. (Friends that have breastfed or are currently breastfeeding, lactation consultants, breastfeeding support groups, etc.) Consider a postpartum doula. (These websites can give you information: dona.org & https://shea.org/) Seek out local breastfeeding resources like the breastfeeding support group at [employer] or [employer]. ______________________________________________________________________________________________________________________________________________________________________________________________________________________________________________________________________________________________________________________________________________________________________________________________________  Judson Roch AND ERRANDS Who can help with a thorough cleaning before baby is born? Make a list of people who will help with housekeeping and chores, like laundry, light cleaning, dishes, bathrooms, etc. Who can run some errands for you? What can you do to make sure you are stocked with basic supplies before baby is born? Who is going to do the  shopping? ______________________________________________________________________________________________________________________________________________________________________________________________________________________________________________________________________________________________________________________________________________________________________________________________________     Family Adjustment *Nurture yourselves.it helps parents be more loving and allows for better bonding with their child.* What sorts of things do you and  partner enjoy doing together? Which activities help you to connect and strengthen your relationship? Make a list of those things. Make a list of people whom you trust to care for your baby so you can have some time together as a couple. What types of things help partner feel connected to Mom? Make a list. What needs will partner have in order to bond with baby? Other children? Who will care for them when you go into labor and while you are in the hospital? Think about what the needs of your older children might be. Who can help you meet those needs? In what ways are you helping them prepare for bringing baby home? List some specific strategies you have for family adjustment. _______________________________________________________________________________________________________________________________________________________________________________________________________________________________________________________________________________________________________________________________________________  SUPPORT *Someone who can empathize with experiences normalizes your problems and makes them more bearable.* Make a list of other friends, neighbors, and/or co-workers you know with infants (and small children, if applicable) with whom you can connect. Make a list of local or online support groups, mom groups, etc. in which you can be  involved. ______________________________________________________________________________________________________________________________________________________________________________________________________________________________________________________________________________________________________________________________________________________________________________________________________  Childcare Plans Investigate and plan for childcare if mom is returning to work. Talk about mom's concerns about her transition back to work. Talk about partner's concerns regarding this transition.  Mental Health *Your mental health is one of the highest priorities for a pregnant or postpartum mom.* 1 in 5 women experience anxiety and/or depression from the time of conception through the first year after birth. Postpartum Mood Disorders are the #1 complication of pregnancy and childbirth and the suffering experienced by these mothers is not necessary! These illnesses are temporary and respond well to treatment, which often includes self-care, social support, talk therapy, and medication when needed. Women experiencing anxiety and depression often say things like: "I'm supposed to be happy.why do I feel so sad?", "Why can't I snap out of it?", "I'm having thoughts that scare me." There is no need to be embarrassed if you are feeling these symptoms: Overwhelmed, anxious, angry, sad, guilty, irritable, hopeless, exhausted but can't sleep You are NOT alone. You are NOT to blame. With help, you WILL be well. Where can I find help? Medical professionals such as your OB, midwife, gynecologist, family practitioner, primary care provider, pediatrician, or mental health providers; [facility] support groups: Feelings After Birth, Breastfeeding Support Group, Baby and Me Group, and Fit 4 Two exercise classes. You have permission to ask for help. It will confirm your feelings, validate your experiences,  share/learn coping strategies, and gain support and encouragement as you heal. You are important! BRAINSTORM Make a list of local resources, including resources for mom and for partner. Identify support groups. Identify people to call late at night - include names and contact info. Talk with partner about perinatal mood and anxiety disorders. Talk with your OB, midwife, and doula about baby blues and about perinatal mood and anxiety disorders. Talk with your pediatrician about perinatal mood and anxiety disorders.   Support & Sanity Savers   What do you really need?  Basics In preparing for a new baby, many expectant parents spend hours shopping for baby clothes, decorating the nursery, and deciding which car seat to buy. Yet most don't think much about what the reality of parenting a newborn will be like, and what they need to make it through that. So, here is the advice of experienced parents. We know you'll read this, and think "they're exaggerating, I don't really need that." Just trust [place] on these, OK? Plan for  all of this, and if it turns out you don't need it, come back and teach [place] how you did it!  Must-Haves (Once baby's survival needs are met, make sure you attend to your own survival needs!) Sleep An average newborn sleeps 16-18 hours per day, over 6-7 sleep periods, rarely more than three hours at a time. It is normal and healthy for a newborn to wake throughout the night... but really hard on parents!! Naps. Prioritize sleep above any responsibilities like: cleaning house, visiting friends, running errands, etc.  Sleep whenever baby sleeps. If you can't nap, at least have restful times when baby eats. The more rest you get, the more patient you will be, the more emotionally stable, and better at solving problems.  Food You may not have realized it would be difficult to eat when you have a newborn. Yet, when we talk to countless new parents, they say things like "it may be 2:00 pm  when I realize I haven't had breakfast yet." Or "every time we sit down to dinner, baby needs to eat, and my food gets cold, so I don't bother to eat it." Finger food. Before your baby is born, stock up with one months' worth of food that: 1) you can eat with one hand while holding a baby, 2) doesn't need to be prepped, 3) is good hot or cold, 4) doesn't spoil when left out for a few hours, and 5) you like to eat. Think about: nuts, dried fruit, Clif bars, pretzels, jerky, gogurt, baby carrots, apples, bananas, crackers, cheez-n-crackers, string cheese, hot pockets or frozen burritos to microwave, garden burgers and breakfast pastries to put in the toaster, yogurt drinks, etc. Restaurant Menus. Make lists of your favorite restaurants & menu items. When family/friends want to help, you can give specific information without much thought. They can either bring you the food or send gift cards for just the right meals. Freezer Meals.  Take some time to make a few meals to put in the freezer ahead of time.  Easy to freeze meals can be anything such as soup, lasagna, chicken pie, or spaghetti sauce. Set up a Meal Schedule.  Ask friends and family to sign up to bring you meals during the first few weeks of being home. (It can be passed around at baby showers!) You have no idea how helpful this will be until you are in the throes of parenting.  MachineLive.it is a great website to check out. Emotional Support Know who to call when you're stressed out. Parenting a newborn is very challenging work. There are times when it totally overwhelms your normal coping abilities. EVERY NEW PARENT NEEDS TO HAVE A PLAN FOR WHO TO CALL WHEN THEY JUST CAN'T COPE ANY MORE. (And it has to be someone other than the baby's other parent!) Before your baby is born, come up with at least one person you can call for support - write their phone number down and post it on the refrigerator. Anxiety & Sadness. Baby blues are normal after  pregnancy; however, there are more severe types of anxiety & sadness which can occur and should not be ignored.  They are always treatable, but you have to take the first step by reaching out for help. [facility] offers a "Mom Talk" group which meets every Tuesday from 10 am - 11 am.  This group is for new moms who need support and connection after their babies are born.  Call [phone].  Really, Really Helpful (Plan  for them! Make sure these happen often!!) Physical Support with Taking Care of Yourselves Asking friends and family. Before your baby is born, set up a schedule of people who can come and visit and help out (or ask a friend to schedule for you). Any time someone says "let me know what I can do to help," sign them up for a day. When they get there, their job is not to take care of the baby (that's your job and your joy). Their job is to take care of you!  Postpartum doulas. If you don't have anyone you can call on for support, look into postpartum doulas:  professionals at helping parents with caring for baby, caring for themselves, getting breastfeeding started, and helping with household tasks. www.padanc.org is a helpful website for learning about doulas in our area. Peer Support / Parent Groups Why: One of the greatest ideas for new parents is to be around other new parents. Parent groups give you a chance to share and listen to others who are going through the same season of life, get a sense of what is normal infant development by watching several babies learn and grow, share your stories of triumph and struggles with empathetic ears, and forgive your own mistakes when you realize all parents are learning by trial and error. Where to find: There are many places you can meet other new parents throughout our community.  [facility] offers the following classes for new moms and their little ones:  Baby and Me (Birth to Crawling) and Breastfeeding Support Group. Go to  [email or web address] or call [phone] for more information. Time for your Relationship It's easy to get so caught up in meeting baby's immediate needs that it's hard to find time to connect with your partner, and meet the needs of your relationship. It's also easy to forget what "quality time with your partner" actually looks like. If you take your baby on a date, you'd be amazed how much of your couple time is spent feeding the baby, diapering the baby, admiring the baby, and talking about the baby. Dating: Try to take time for just the two of you. Babysitter tip: Sometimes when moms are breastfeeding a newborn, they find it hard to figure out how to schedule outings around baby's unpredictable feeding schedules. Have the babysitter come for a three hour period. When she comes over, if baby has just eaten, you can leave right away, and come back in two hours. If baby hasn't fed recently, you start the date at home. Once baby gets hungry and gets a good feeding in, you can head out for the rest of your date time. Date Nights at Home: If you can't get out, at least set aside one evening a week to prioritize your relationship: whenever baby dozes off or doesn't have any immediate needs, spend a little time focusing on each other. Potential conflicts: The main relationship conflicts that come up for new parents are: issues related to sexuality, financial stresses, a feeling of an unfair division of household tasks, and conflicts in parenting styles. The more you can work on these issues before baby arrives, the better!  Fun and Frills (Don't forget these. and don't feel guilty for indulging in them!) Everyone has something in life that is a fun little treat that they do just for themselves. It may be: reading the morning paper, or going for a daily jog, or having coffee with a friend once a week, or going to a movie on  Friday nights, or fine chocolates, or bubble baths, or curling up with a good  book. Unless you do fun things for yourself every now and then, it's hard to have the energy for fun with your baby. Whatever your "special" treats are, make sure you find a way to continue to indulge in them after your baby is born. These special moments can recharge you, and allow you to return to baby with a new joy   PERINATAL MOOD DISORDERS: MATERNAL MENTAL HEALTH FROM CONCEPTION THROUGH THE POSTPARTUM PERIOD   _________________________________________Emergency and Crisis Resources If you are an imminent risk to self or others, are experiencing intense personal distress, and/or have noticed significant changes in activities of daily living, call:  [facility]: [phone]  [address], [place], [place], [address] Mobile Crisis: [phone] National Suicide Hotline: 61 Or visit the following crisis centers: Local Emergency Departments [facility]:  [address], [place]  Mon-Friday 8am-3pm, [phone]                                                                                  ___________ Non-Crisis Resources To identify specific providers that are covered by your insurance, contact your insurance company or local agencies:   Postpartum Support International- Warm-line: 7738497160                                                      __Outpatient Therapy and Medication Management   Providers:  [facility]: [phone] Hours: 9AM-5PM  Insurance Accepted: Pilar Jarvis, BCBS, Crista Luria, [place], Medicare  [employer] [facility]): [phone] Hours: 8AM-5:30PM  nsurance Accepted: All insurances EXCEPT AARP, [place], [place], and [employer] of the [place]: [phone] Hours: 8AM-8PM Insurance Accepted: Ulla Gallo, Crista Luria, [place], Medicare, [phone] [facility]: [phone] Hours: 8:30AM-7PM Insurance Accepted: Ulla Gallo, Medicaid, Medicare, Tricare, [employer] Counsel[facility]:  [phone]   Hours:9AM-5PM Insurance Accepted:  Monia Pouch, Ezequiel Essex, [employer], [place], [employer] Care  [facility]: [phone] Hours: 9AM-5:30PM Insurance Accepted: Pilar Jarvis, Teodoro Spray, and Medicaid, Medicare, [facility]:  [phone] Hours: 9am-5pm Insurance Accepted: BCBS; they do not accept Medicaid/Medicare [facility]: [phone] Hours: 9am-9pm Insurance Accepted: All major insurance including Medicaid and Medicare [facility]: [phone] Hours: 9AM- 5PM Insurance Accepted: All insurances EXCEPT Medicaid and Medicare. [facility]: [phone]   ____________                                                                     Parenting Support Groups [facility] :  [address], [place], [place], [address] [phone] [facility]  Regional:  [phone] Family Support Network: (support for children in the NICU and/or with special needs), [phone]     _____________                                                                                  Online Resources Postpartum Support International: SeekAlumni.co.za  800-944-4PPD Supporting Moms:  www.momssupportingmoms.net

## 2024-04-23 ENCOUNTER — Ambulatory Visit: Payer: Self-pay

## 2024-04-23 ENCOUNTER — Ambulatory Visit (INDEPENDENT_AMBULATORY_CARE_PROVIDER_SITE_OTHER): Admitting: Clinical

## 2024-04-23 VITALS — BP 118/78 | HR 84 | Wt 144.0 lb

## 2024-04-23 DIAGNOSIS — Z8659 Personal history of other mental and behavioral disorders: Secondary | ICD-10-CM

## 2024-04-23 DIAGNOSIS — Z3A31 31 weeks gestation of pregnancy: Secondary | ICD-10-CM | POA: Diagnosis not present

## 2024-04-23 DIAGNOSIS — F4323 Adjustment disorder with mixed anxiety and depressed mood: Secondary | ICD-10-CM

## 2024-04-23 DIAGNOSIS — Z3403 Encounter for supervision of normal first pregnancy, third trimester: Secondary | ICD-10-CM | POA: Diagnosis not present
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
## 2024-04-23 NOTE — Patient Instructions (Signed)
 [place] WEBSITE (REGISTER, BABY CLASSES):  [email or web address]    [facility] Therapist [place] Warren Mering [phone]    DOMESTIC VIOLENCE RESOURCES  Family Justice Center  The [facility]) is a "one stop shop" for victims of domestic violence, sexual assault, child abuse, and elder abuse. Under one roof, 45 professionals from 14 different disciplines work together to provide consolidated and coordinated safety, legal, social, and health services to individuals and families in need. At the [facility], victims of domestic and sexual violence can come to one location to access a wide range of supportive resources, such as talking with a victim advocate, getting assistance with filing a restraining order, planning for their safety, talking to a Hydrographic surveyor, meeting with a professional to discuss civil and criminal legal issues, and gaining information on how to access shelter and other community resources.  From the [facility], victims of domestic and sexual violence can file electronically for a temporary protective order, also known as an ex-parte (50B). These orders must be filed by 12:30 pm on a week day in order to be heard by the presiding judge that same day.     [facility] [address], [address] [place], [place] [address] [phone])  Walk-in Appointment Hours Regular Hours: Mondays through Fridays 8:30 am to 4:30 pm During COVID-19: Walk-in hours are amended to Monday through Fridays 8:30 am-1:00 pm  Facts About Domestic Violence  Domestic Violence is the leading cause of injury to women One out of every four women will experience Domestic Violence in her lifetime Most cases of Domestic Violence are never reported to the police Every year,one of three women who is a victim of homicide is murdered by her partner Dating violence or abuse affects one out of every four teens   Domestic  Violence Press photographer Domestic Violence Hotline:  [phone] (SAFE) or TTY 5086753759. www.thehotline.org  The [employer] Violence Hotline provides an immediate response to victims of  domestic violence and their families, and a seamless referral system to community  programs in response to the needs of the women, men and children on the line. The  Hotline, operated 24/7 and available in 170 languages, is the first step to safety for many  callers whose unique situation is assessed and evaluated to meet short term needs, with  a local referral to assist the caller in dealing with the long-term effects of family violence.
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
## 2024-05-03 ENCOUNTER — Encounter: Payer: Self-pay | Admitting: Certified Nurse Midwife

## 2024-05-07 ENCOUNTER — Encounter: Payer: Self-pay | Admitting: *Deleted

## 2024-05-07 ENCOUNTER — Encounter: Payer: Self-pay | Admitting: Family

## 2024-05-07 NOTE — BH Specialist Note (Signed)
 Pt did not arrive to video visit and did not answer the phone; Left HIPPA-compliant message to call back Warren from [employer] for [employer] at [facility] at  [phone] [facility] office).  ?; left MyChart message for patient.  ? ?

## 2024-05-10 ENCOUNTER — Ambulatory Visit: Admitting: Clinical

## 2024-05-10 DIAGNOSIS — Z91199 Patient's noncompliance with other medical treatment and regimen due to unspecified reason: Secondary | ICD-10-CM

## 2024-05-21 ENCOUNTER — Encounter: Payer: Self-pay | Admitting: Advanced Practice Midwife

## 2024-05-21 ENCOUNTER — Encounter: Payer: Self-pay | Admitting: *Deleted

## 2024-05-21 ENCOUNTER — Telehealth: Payer: Self-pay | Admitting: Family Medicine

## 2024-05-21 NOTE — Telephone Encounter (Signed)
 Called patient and left detailed message about upcoming appointment with office number for her to call if she needs to reschedule.

## 2024-05-24 NOTE — Progress Notes (Unsigned)
   PRENATAL VISIT NOTE  Subjective:  Kara Short is a 18 y.o. G1P0 at [redacted]w[redacted]d being seen today for ongoing prenatal care.  She is currently monitored for the following issues for this {Blank single:19197::high-risk,low-risk} pregnancy and has Encounter for supervision of normal first pregnancy in third trimester; Carrier of spinal muscular atrophy; Chlamydia trachomatis infection in pregnancy in first trimester; and Encounter for genetic screening on their problem list.  Patient reports {sx:14538}.   .  .   . Denies leaking of fluid.   The following portions of the patient's history were reviewed and updated as appropriate: allergies, current medications, past family history, past medical history, past social history, past surgical history and problem list.   Objective:    There were no vitals filed for this visit.  Fetal Status:           General: Alert, oriented and cooperative. Patient is in no acute distress.  Skin: Skin is warm and dry. No rash noted.   Cardiovascular: Normal heart rate noted  Respiratory: Normal respiratory effort, no problems with respiration noted  Abdomen: Soft, gravid, appropriate for gestational age.        Pelvic: {Blank single:19197::Cervical exam performed in the presence of a chaperone,Cervical exam deferred}        Extremities: Normal range of motion.     Mental Status: Normal mood and affect. Normal behavior. Normal judgment and thought content.   Assessment and Plan:  Pregnancy: G1P0 at [redacted]w[redacted]d 1. Adjustment disorder with mixed anxiety and depressed mood (Primary) ***  2. [redacted] weeks gestation of pregnancy ***  3. Encounter for supervision of normal first pregnancy in third trimester ***  {Blank single:19197::Term,Preterm} labor symptoms and general obstetric precautions including but not limited to vaginal bleeding, contractions, leaking of fluid and fetal movement were reviewed in detail with the patient. Please refer to After Visit  Summary for other counseling recommendations.   Return in about 1 week (around 06/01/2024) for LOB.  Future Appointments  Date Time Provider Department Center  05/25/2024  1:55 PM Regino Camie DELENA EDDY [facility]  06/04/2024  9:00 AM CENTERING PROVIDER [facility]  06/11/2024  9:00 AM CENTERING PROVIDER [facility]  06/18/2024  9:00 AM CENTERING PROVIDER [facility]    Camie DELENA Regino, CNM

## 2024-05-25 ENCOUNTER — Ambulatory Visit (INDEPENDENT_AMBULATORY_CARE_PROVIDER_SITE_OTHER): Admitting: Certified Nurse Midwife

## 2024-05-25 ENCOUNTER — Other Ambulatory Visit: Payer: Self-pay

## 2024-05-25 VITALS — BP 125/82 | HR 116 | Wt 152.5 lb

## 2024-05-25 DIAGNOSIS — F4323 Adjustment disorder with mixed anxiety and depressed mood: Secondary | ICD-10-CM | POA: Diagnosis not present

## 2024-05-25 DIAGNOSIS — Z3A35 35 weeks gestation of pregnancy: Secondary | ICD-10-CM

## 2024-05-25 DIAGNOSIS — Z3403 Encounter for supervision of normal first pregnancy, third trimester: Secondary | ICD-10-CM | POA: Diagnosis not present

## 2024-05-25 DIAGNOSIS — Z3401 Encounter for supervision of normal first pregnancy, first trimester: Secondary | ICD-10-CM

## 2024-05-25 MED ORDER — PREPLUS 27-1 MG PO TABS: 1.0000 | ORAL_TABLET | Freq: Every day | ORAL | 13 refills | Status: AC

## 2024-05-25 NOTE — Patient Instructions (Signed)

## 2024-06-04 ENCOUNTER — Ambulatory Visit: Payer: Self-pay | Admitting: Family

## 2024-06-04 ENCOUNTER — Other Ambulatory Visit (HOSPITAL_COMMUNITY)
Admission: RE | Admit: 2024-06-04 | Discharge: 2024-06-04 | Disposition: A | Discharge status: Home / Self Care | Source: Ambulatory Visit | Attending: Family | Admitting: Family

## 2024-06-04 VITALS — BP 127/81 | HR 81 | Wt 152.6 lb

## 2024-06-04 DIAGNOSIS — Z3403 Encounter for supervision of normal first pregnancy, third trimester: Secondary | ICD-10-CM

## 2024-06-04 DIAGNOSIS — Z1332 Encounter for screening for maternal depression: Secondary | ICD-10-CM

## 2024-06-04 DIAGNOSIS — Z3A37 37 weeks gestation of pregnancy: Secondary | ICD-10-CM | POA: Diagnosis not present

## 2024-06-04 NOTE — Progress Notes (Signed)
Pregnancy risk form completed. Kara Short

## 2024-06-04 NOTE — Progress Notes (Signed)
  [facility] PRENATAL VISIT NOTE- Centering Pregnancy Group #21  Subjective:  Kara Short is a 18 y.o. G1P0 at [redacted]w[redacted]d being seen today for ongoing prenatal care through Centering Pregnancy.  She is currently monitored for the following issues for this low-risk pregnancy and has Encounter for supervision of normal first pregnancy in third trimester; Carrier of spinal muscular atrophy; Chlamydia trachomatis infection in pregnancy in first trimester; and Encounter for genetic screening on their problem list.  Patient reports no complaints.   .  .  Movement: Present. Denies leaking of fluid/ROM.   The following portions of the patient's history were reviewed and updated as appropriate: allergies, current medications, past family history, past medical history, past social history, past surgical history and problem list. Problem list updated.  Objective:   Vitals:   06/04/24 0922  BP: 127/81  Pulse: 81     Fetal Status: Fetal Heart Rate (bpm): 154 Fundal Height: 37 cm Movement: Present  Presentation: Vertex  General:  Alert, oriented and cooperative. Patient is in no acute distress.  Skin: Skin is warm and dry. No rash noted.   Cardiovascular: Normal heart rate noted  Respiratory: Normal respiratory effort, no problems with respiration noted  Abdomen: Soft, gravid, appropriate for gestational age.  Pain/Pressure: Present (sharp pain in groin)     Pelvic: Cervical exam deferred        Extremities: Normal range of motion.     Mental Status: Normal mood and affect. Normal behavior. Normal judgment and thought content.   Assessment and Plan:  Pregnancy: G1P0 at [redacted]w[redacted]d  1. Encounter for supervision of normal first pregnancy in third trimester (Primary) - GC/Chlamydia probe amp ([place])not at [facility] - Culture, beta strep (group b only)   Term labor symptoms and general obstetric precautions including but not limited to vaginal bleeding, contractions, leaking of  fluid and fetal movement were reviewed in detail with the patient. Please refer to After Visit Summary for other counseling recommendations.   Centering Pregnancy, Session #8: Reviewed resources in mother's notebook regarding preparing for baby.     Facilitated discussion today:  Activities:  What's in the Basket?  Also reviewed importance of choosing pediatrician and family planning options.    Fundal height and FHR appropriate today unless noted otherwise in plan.   Return in about 1 week (around 06/11/2024) for Centering Group (Scheduled).  Future Appointments  Date Time Provider Department Center  06/11/2024  9:00 AM CENTERING PROVIDER [facility]  06/18/2024  9:00 AM CENTERING PROVIDER [facility]    Tjopijy LOISE Pouch, CNM

## 2024-06-04 NOTE — Patient Instructions (Signed)

## 2024-06-07 LAB — GC/CHLAMYDIA PROBE AMP (~~LOC~~) NOT AT ARMC
Chlamydia: NEGATIVE
Comment: NEGATIVE
Comment: NORMAL
Neisseria Gonorrhea: NEGATIVE

## 2024-06-07 LAB — CULTURE, BETA STREP (GROUP B ONLY): Strep Gp B Culture: POSITIVE — AB

## 2024-06-08 ENCOUNTER — Encounter: Payer: Self-pay | Admitting: Family

## 2024-06-08 ENCOUNTER — Ambulatory Visit: Payer: Self-pay | Admitting: Family

## 2024-06-08 DIAGNOSIS — B951 Streptococcus, group B, as the cause of diseases classified elsewhere: Secondary | ICD-10-CM | POA: Insufficient documentation

## 2024-06-11 ENCOUNTER — Encounter: Payer: Self-pay | Admitting: Advanced Practice Midwife

## 2024-06-11 NOTE — Progress Notes (Deleted)
   PRENATAL VISIT NOTE  Subjective:  Kara Short is a 18 y.o. G1P0 at [redacted]w[redacted]d being seen today for ongoing prenatal care.  She is currently monitored for the following issues for this {Blank single:19197::high-risk,low-risk} pregnancy and has Encounter for supervision of normal first pregnancy in third trimester; Carrier of spinal muscular atrophy; Chlamydia trachomatis infection in pregnancy in first trimester; Encounter for genetic screening; and Group beta Strep positive on their problem list.   Patient reports {sx:14538}.   .  .   . Denies leaking of fluid.   The following portions of the patient's history were reviewed and updated as appropriate: allergies, current medications, past family history, past medical history, past social history, past surgical history and problem list.   Objective:  There were no vitals filed for this visit.  Fetal Status:           General:  Alert, oriented and cooperative. Patient is in no acute distress.  Skin: Skin is warm and dry. No rash noted.   Cardiovascular: Normal heart rate noted  Respiratory: Normal respiratory effort, no problems with respiration noted  Abdomen: Soft, gravid, appropriate for gestational age.        Pelvic: {Blank single:19197::Cervical exam performed in the presence of a chaperone,Cervical exam deferred}        Extremities: Normal range of motion.     Mental Status: Normal mood and affect. Normal behavior. Normal judgment and thought content.   Assessment and Plan:  Pregnancy: G1P0 at [redacted]w[redacted]d 1. Encounter for supervision of normal first pregnancy in third trimester (Primary)  2. Group beta Strep positive  3. [redacted] weeks gestation of pregnancy  {Blank single:19197::Term,Preterm} labor symptoms and general obstetric precautions including but not limited to vaginal bleeding, contractions, leaking of fluid and fetal movement were reviewed in detail with the patient. Please refer to After Visit Summary for other  counseling recommendations.   No follow-ups on file.  Future Appointments  Date Time Provider Department Center  06/11/2024  9:00 AM Claudene Edelson , CNM [facility]  06/18/2024  9:00 AM CENTERING PROVIDER [facility]    Inetha Maret  Claudene HOWARD [facility] for [employer]

## 2024-06-18 ENCOUNTER — Ambulatory Visit: Payer: Self-pay | Admitting: Family

## 2024-06-18 ENCOUNTER — Inpatient Hospital Stay (HOSPITAL_COMMUNITY)
Admission: AD | Admit: 2024-06-18 | Discharge: 2024-06-21 | DRG: 807 | Disposition: A | Discharge status: Home / Self Care | Attending: Obstetrics & Gynecology | Admitting: Obstetrics & Gynecology

## 2024-06-18 ENCOUNTER — Encounter (HOSPITAL_COMMUNITY): Payer: Self-pay | Admitting: Obstetrics and Gynecology

## 2024-06-18 VITALS — BP 151/101 | HR 80 | Wt 163.2 lb

## 2024-06-18 DIAGNOSIS — O9902 Anemia complicating childbirth: Secondary | ICD-10-CM | POA: Diagnosis present

## 2024-06-18 DIAGNOSIS — B951 Streptococcus, group B, as the cause of diseases classified elsewhere: Secondary | ICD-10-CM | POA: Diagnosis not present

## 2024-06-18 DIAGNOSIS — O1404 Mild to moderate pre-eclampsia, complicating childbirth: Secondary | ICD-10-CM | POA: Diagnosis not present

## 2024-06-18 DIAGNOSIS — Z8249 Family history of ischemic heart disease and other diseases of the circulatory system: Secondary | ICD-10-CM

## 2024-06-18 DIAGNOSIS — O26893 Other specified pregnancy related conditions, third trimester: Secondary | ICD-10-CM | POA: Diagnosis not present

## 2024-06-18 DIAGNOSIS — Z3A39 39 weeks gestation of pregnancy: Secondary | ICD-10-CM

## 2024-06-18 DIAGNOSIS — O99824 Streptococcus B carrier state complicating childbirth: Secondary | ICD-10-CM | POA: Diagnosis present

## 2024-06-18 DIAGNOSIS — Z148 Genetic carrier of other disease: Secondary | ICD-10-CM | POA: Diagnosis not present

## 2024-06-18 DIAGNOSIS — Z7722 Contact with and (suspected) exposure to environmental tobacco smoke (acute) (chronic): Secondary | ICD-10-CM | POA: Diagnosis not present

## 2024-06-18 DIAGNOSIS — O163 Unspecified maternal hypertension, third trimester: Secondary | ICD-10-CM | POA: Diagnosis not present

## 2024-06-18 DIAGNOSIS — O1490 Unspecified pre-eclampsia, unspecified trimester: Secondary | ICD-10-CM | POA: Diagnosis present

## 2024-06-18 DIAGNOSIS — Z3490 Encounter for supervision of normal pregnancy, unspecified, unspecified trimester: Secondary | ICD-10-CM

## 2024-06-18 DIAGNOSIS — Z3403 Encounter for supervision of normal first pregnancy, third trimester: Secondary | ICD-10-CM

## 2024-06-18 DIAGNOSIS — O99820 Streptococcus B carrier state complicating pregnancy: Secondary | ICD-10-CM | POA: Diagnosis not present

## 2024-06-18 LAB — TYPE AND SCREEN
ABO/RH(D): A POS
Antibody Screen: NEGATIVE

## 2024-06-18 LAB — CBC WITH DIFFERENTIAL/PLATELET
Abs Immature Granulocytes: 0.11 K/uL — ABNORMAL HIGH (ref 0.00–0.07)
Basophils Absolute: 0.1 K/uL (ref 0.0–0.1)
Basophils Relative: 1 %
Eosinophils Absolute: 0.1 K/uL (ref 0.0–0.5)
Eosinophils Relative: 1 %
HCT: 31.6 % — ABNORMAL LOW (ref 36.0–46.0)
Hemoglobin: 10.2 g/dL — ABNORMAL LOW (ref 12.0–15.0)
Immature Granulocytes: 1 %
Lymphocytes Relative: 21 %
Lymphs Abs: 2.2 K/uL (ref 0.7–4.0)
MCH: 26.4 pg (ref 26.0–34.0)
MCHC: 32.3 g/dL (ref 30.0–36.0)
MCV: 81.9 fL (ref 80.0–100.0)
Monocytes Absolute: 0.7 K/uL (ref 0.1–1.0)
Monocytes Relative: 7 %
Neutro Abs: 7.2 K/uL (ref 1.7–7.7)
Neutrophils Relative %: 69 %
Platelets: 197 K/uL (ref 150–400)
RBC: 3.86 MIL/uL — ABNORMAL LOW (ref 3.87–5.11)
RDW: 15.7 % — ABNORMAL HIGH (ref 11.5–15.5)
WBC: 10.3 K/uL (ref 4.0–10.5)
nRBC: 0.2 % (ref 0.0–0.2)

## 2024-06-18 LAB — COMPREHENSIVE METABOLIC PANEL WITH GFR
ALT: 14 U/L (ref 0–44)
AST: 22 U/L (ref 15–41)
Albumin: 2.7 g/dL — ABNORMAL LOW (ref 3.5–5.0)
Alkaline Phosphatase: 174 U/L — ABNORMAL HIGH (ref 38–126)
Anion gap: 11 (ref 5–15)
BUN: 5 mg/dL — ABNORMAL LOW (ref 6–20)
CO2: 19 mmol/L — ABNORMAL LOW (ref 22–32)
Calcium: 8.6 mg/dL — ABNORMAL LOW (ref 8.9–10.3)
Chloride: 106 mmol/L (ref 98–111)
Creatinine, Ser: 0.64 mg/dL (ref 0.44–1.00)
GFR, Estimated: >60 mL/min (ref >60)
Glucose, Bld: 106 mg/dL — ABNORMAL HIGH (ref 70–99)
Potassium: 3.6 mmol/L (ref 3.5–5.1)
Sodium: 136 mmol/L (ref 135–145)
Total Bilirubin: 0.5 mg/dL (ref 0.0–1.2)
Total Protein: 5.8 g/dL — ABNORMAL LOW (ref 6.5–8.1)

## 2024-06-18 LAB — PROTEIN / CREATININE RATIO, URINE
Creatinine, Urine: 213 mg/dL
Protein Creatinine Ratio: 0.35 mg/mg{creat} — ABNORMAL HIGH (ref 0.00–0.15)
Total Protein, Urine: 75 mg/dL

## 2024-06-18 MED ORDER — ACETAMINOPHEN 325 MG PO TABS: 650.0000 mg | ORAL_TABLET | ORAL | Status: DC | PRN

## 2024-06-18 MED ORDER — LIDOCAINE HCL (PF) 1 % IJ SOLN: 30.0000 mL | INTRAMUSCULAR | Status: DC | PRN

## 2024-06-18 MED ORDER — OXYTOCIN-SODIUM CHLORIDE 30-0.9 UT/500ML-% IV SOLN
1.0000 m[IU]/min | INTRAVENOUS | Status: DC
  Administered 2024-06-18: 1 m[IU]/min via INTRAVENOUS

## 2024-06-18 MED ORDER — MISOPROSTOL 50MCG HALF TABLET
50.0000 ug | ORAL_TABLET | Freq: Once | ORAL | Status: AC
  Administered 2024-06-18: 50 ug via BUCCAL
  Filled 2024-06-18: qty 1

## 2024-06-18 MED ORDER — OXYCODONE-ACETAMINOPHEN 5-325 MG PO TABS: 2.0000 | ORAL_TABLET | ORAL | Status: DC | PRN

## 2024-06-18 MED ORDER — HYDROXYZINE HCL 50 MG PO TABS: 50.0000 mg | ORAL_TABLET | Freq: Four times a day (QID) | ORAL | Status: DC | PRN

## 2024-06-18 MED ORDER — PENICILLIN G POT IN DEXTROSE 60000 UNIT/ML IV SOLN
3.0000 10*6.[IU] | INTRAVENOUS | Status: DC
  Administered 2024-06-18 – 2024-06-19 (×5): 3 10*6.[IU] via INTRAVENOUS
  Filled 2024-06-18 (×8): qty 50

## 2024-06-18 MED ORDER — SODIUM CHLORIDE 0.9% FLUSH: 3.0000 mL | INTRAVENOUS | Status: DC | PRN

## 2024-06-18 MED ORDER — PHENYLEPHRINE 80 MCG/ML (10ML) SYRINGE FOR IV PUSH (FOR BLOOD PRESSURE SUPPORT): 80.0000 ug | PREFILLED_SYRINGE | INTRAVENOUS | Status: DC | PRN

## 2024-06-18 MED ORDER — DIPHENHYDRAMINE HCL 50 MG/ML IJ SOLN: 12.5000 mg | INTRAMUSCULAR | Status: DC | PRN

## 2024-06-18 MED ORDER — FENTANYL-BUPIVACAINE-NACL 0.5-0.125-0.9 MG/250ML-% EP SOLN
12.0000 mL/h | EPIDURAL | Status: DC | PRN
  Administered 2024-06-19 (×2): 12 mL/h via EPIDURAL
  Filled 2024-06-18 (×2): qty 250

## 2024-06-18 MED ORDER — FENTANYL CITRATE (PF) 100 MCG/2ML IJ SOLN
50.0000 ug | INTRAMUSCULAR | Status: DC | PRN
  Administered 2024-06-18 (×2): 100 ug via INTRAVENOUS
  Filled 2024-06-18 (×2): qty 2

## 2024-06-18 MED ORDER — EPHEDRINE 5 MG/ML INJ: 10.0000 mg | INTRAVENOUS | Status: DC | PRN

## 2024-06-18 MED ORDER — LACTATED RINGERS IV SOLN: 500.0000 mL | Freq: Once | INTRAVENOUS | Status: DC

## 2024-06-18 MED ORDER — SODIUM CHLORIDE 0.9 % IV SOLN: 250.0000 mL | INTRAVENOUS | Status: DC | PRN

## 2024-06-18 MED ORDER — TERBUTALINE SULFATE 1 MG/ML IJ SOLN: 0.2500 mg | Freq: Once | INTRAMUSCULAR | Status: DC | PRN

## 2024-06-18 MED ORDER — OXYTOCIN BOLUS FROM INFUSION
333.0000 mL | Freq: Once | INTRAVENOUS | Status: AC
  Administered 2024-06-19: 333 mL via INTRAVENOUS

## 2024-06-18 MED ORDER — OXYTOCIN-SODIUM CHLORIDE 30-0.9 UT/500ML-% IV SOLN
2.5000 [IU]/h | INTRAVENOUS | Status: DC
  Filled 2024-06-18: qty 500

## 2024-06-18 MED ORDER — PHENYLEPHRINE 80 MCG/ML (10ML) SYRINGE FOR IV PUSH (FOR BLOOD PRESSURE SUPPORT)
80.0000 ug | PREFILLED_SYRINGE | INTRAVENOUS | Status: DC | PRN
Start: 2024-06-18 — End: 2024-06-19

## 2024-06-18 MED ORDER — OXYCODONE-ACETAMINOPHEN 5-325 MG PO TABS: 1.0000 | ORAL_TABLET | ORAL | Status: DC | PRN

## 2024-06-18 MED ORDER — SODIUM CHLORIDE 0.9 % IV SOLN
5.0000 10*6.[IU] | Freq: Once | INTRAVENOUS | Status: AC
  Administered 2024-06-18: 5 10*6.[IU] via INTRAVENOUS
  Filled 2024-06-18: qty 5

## 2024-06-18 MED ORDER — LACTATED RINGERS IV SOLN: 500.0000 mL | INTRAVENOUS | Status: DC | PRN

## 2024-06-18 MED ORDER — ONDANSETRON HCL 4 MG/2ML IJ SOLN
4.0000 mg | Freq: Four times a day (QID) | INTRAMUSCULAR | Status: DC | PRN
  Administered 2024-06-19: 4 mg via INTRAVENOUS
  Filled 2024-06-18: qty 2

## 2024-06-18 MED ORDER — SODIUM CHLORIDE 0.9% FLUSH: 3.0000 mL | Freq: Two times a day (BID) | INTRAVENOUS | Status: DC

## 2024-06-18 NOTE — MAU Provider Note (Signed)
 Pt informed that the ultrasound is considered a limited OB ultrasound and is not intended to be a complete ultrasound exam.  Patient also informed that the ultrasound is not being completed with the intent of assessing for fetal or placental anomalies or any pelvic abnormalities.  Explained that the purpose of today's ultrasound is to assess for  presentation.  Patient acknowledges the purpose of the exam and the limitations of the study.     Vertex position.  Kara Delon FERNS, NP 06/18/2024 12:39 PM

## 2024-06-18 NOTE — Patient Instructions (Signed)

## 2024-06-18 NOTE — Progress Notes (Signed)
 Labor Progress Note Tagen Brethauer is a 18 y.o. G1P0 at [redacted]w[redacted]d presented for IOL secondary to new preE.   S:  Cambry is currently taking laps around the OB unit with her support people. Has no concerns at this time.   O:  BP 137/85   Pulse 81   Temp 98.7 F (37.1 C) (Axillary)   Resp 17   Ht 5' 1 (1.549 m)   Wt 73.8 kg   LMP 09/25/2023 (Approximate)   SpO2 100%   BMI 30.74 kg/m   EFM: baseline 145 bpm/ moderate variability/ + accels/ -- decels  Toco/IUPC: irritable/irregular SVE: Dilation: Fingertip Presentation: Vertex Exam by:: j walker cnm Pitocin: None  A/P: Cherly Erno is a 18 y.o. G1P0 at [redacted]w[redacted]d presented for IOL secondary to new preE.  1. Labor: IOL; s/p Bcyto, currently walking laps 2. FWB: Cat 1 3. Pain: Epidural 4. GBS+: PCN  Anticipate SVD.  Terrace Compton, MD 6:59 PM

## 2024-06-18 NOTE — MAU Note (Signed)
 Kara Short is a 18 y.o. at [redacted]w[redacted]d here in MAU reporting: she was sent from office due to elevated b/p. Was there for routine visit. Denies headache, visual symptoms, and RUQ pain. Reports some cramping. Reports positive fetal movement.   Onset of complaint: today Pain score: 0/10 Vitals:   06/18/24 1205  Resp: 18  Temp: 99 F (37.2 C)  SpO2: 100%      Lab orders placed from triage: none

## 2024-06-18 NOTE — H&P (Cosign Needed Addendum)
 OBSTETRIC ADMISSION HISTORY AND PHYSICAL  Kara Short is a 18 y.o. female G1P0 with IUP at [redacted]w[redacted]d by LMP presenting for new . She reports +FMs, No LOF, no VB, no blurry vision, headaches or peripheral edema, and RUQ pain.  She plans on  breastfeeding. She wants either OCPs or the patch, to be discussed at her postpartum visit.   She received her prenatal care at [facility] w/ [facility]  Dating: By LMP --->  Estimated Date of Delivery: 06/25/24  Sono: @[redacted]w[redacted]d , CWD, normal anatomy, cephalic presentation, posterior fundal lie, 302g, 31% EFW  Prenatal History/Complications: LOW RISK PREGNANCY - new onset HTN without diagnosis of gHTN or PreE - GBS+ - chlamydia infection in 1st trimester   NURSING  PROVIDER  Office Location [facility] Dating by LMP  [facility] Model [facility] An[facility] U/S Nml at 19 wks  Initiated care at  [facility]	[place]  English               LAB RESULTS   Support Person Biomedical engineer (fob) Genetics NIPS: LRM AFP: Neg      NT/IT (FT only)        Carrier Screen Horizon: +SMA carrier  Rhogam   N/A A1C/GTT Early HgbA1C: 5.2% Third trimester 2 hr GTT:   Flu Vaccine Declined-05/25/24      TDaP Vaccine  04/09/24 Blood Type  A positive   RSV Vaccine Declined Antibody  Negative  COVID Vaccine   Rubella  3.66  Feeding Plan both RPR  Nonreactive  Contraception Undecided; leaning toward OCPs HBsAg  Negative  Circumcision Yes HIV  Nonreactive  Pediatrician  List given HCVAb  Nonreactive  Prenatal Classes  Offered       BTL Consent   Pap No results found for: DIAGPAP  BTL Pre-payment   GC/CT Initial:  neg/POS; TOC neg 36wks:  Neg x 2  VBAC Consent N/a GBS  Positive For PCN allergy, check sensitivities   BRx Optimized? [ ]  yes   [ ]  no      DME Rx [ x] BP cuff [ ]  Weight Scale Waterbirth  [ ]  Class [ ]  Consent [ ]  CNM visit  PHQ9 & GAD7 [ x ] new OB [  x] 28 weeks  [ x ] 36 weeks Induction  [ ]  Orders Entered [ ] Foley Y/N   Past Medical  History: History reviewed. No pertinent past medical history.  Past Surgical History: History reviewed. No pertinent surgical history.  Obstetrical History: OB History     Gravida  1   Para      Term      Preterm      AB      Living         SAB      IAB      Ectopic      Multiple      Live Births             Social History Social History   Socioeconomic History   Marital status: Single    Spouse name: Not on file   Number of children: Not on file   Years of education: Not on file   Highest education level: Not on file  Occupational History   Not on file  Tobacco Use   Smoking status: Passive Smoke Exposure - Never Smoker   Smokeless tobacco: Never  Vaping Use  Vaping status: Never Used  Substance and Sexual Activity   Alcohol use: Not Currently   Drug use: Not Currently   Sexual activity: Yes    Birth control/protection: None  Other Topics Concern   Not on file  Social History Narrative   Not on file   Social Drivers of Health   Financial Resource Strain: Not on file  Food Insecurity: No Food Insecurity (06/04/2024)   Hunger Vital Sign    Worried About Running Out of Food in the Last Year: Never true    Ran Out of Food in the Last Year: Never true  Transportation Needs: No Transportation Needs (06/18/2024)   PRAPARE - Administrator, Civil Service (Medical): No    Lack of Transportation (Non-Medical): No  Physical Activity: Not on file  Stress: Not on file  Social Connections: Not on file   Family History: Family History  Problem Relation Age of Onset   Hypertension Mother    Allergies: No Known Allergies  Medications Prior to Admission  Medication Sig Dispense Refill Last Dose/Taking   Prenatal Vit-Fe Fumarate-FA (PREPLUS) 27-1 MG TABS Take 1 tablet by mouth daily. 30 tablet 13 06/17/2024   aspirin  EC 81 MG tablet Take 1 tablet (81 mg total) by mouth daily. Swallow whole. (Patient not taking: Reported on 06/18/2024)  30 tablet 12    Blood Pressure Monitoring DEVI 1 each by Does not apply route once a week. (Patient not taking: Reported on 06/18/2024) 1 each 0    ferrous sulfate  325 (65 FE) MG EC tablet Take 1 tablet (325 mg total) by mouth 2 (two) times daily. (Patient not taking: Reported on 06/18/2024) 30 tablet 3    Review of Systems  All systems reviewed and negative except as stated in HPI  Blood pressure 137/85, pulse 81, temperature 98.7 F (37.1 C), temperature source Axillary, resp. rate 17, height 5' 1 (1.549 m), weight 162 lb 11.2 oz (73.8 kg), last menstrual period 09/25/2023, SpO2 100%. General appearance: alert, cooperative, and no distress Lungs: normal effort, no problems with respiration noted Heart: regular rate and rhythm, warm and well perfused Abdomen: soft, non-tender, gravid Pelvic:normal external female genitalia without lesion or bleeding Extremities: Homans sign is negative, no sign of DVT Presentation: cephalic Fetal monitoringBaseline: 140 bpm, Variability: Good {> 6 bpm), Accelerations: Reactive, and Decelerations: Absent Uterine activityNone Dilation: Fingertip Effacement (%): 50 Station: -3 Exam by:: j Colbe Viviano cnm  Prenatal labs: ABO, Rh: --/--/A POS (10/17 1211) Antibody: NEG (10/17 1211) Rubella: 3.66 (04/14 1507) RPR: Non Reactive (07/29 0857)  HBsAg: Negative (04/14 1507)  HIV: Non Reactive (07/29 0857)  GBS: Positive/-- (10/03 1000)    Lab Results  Component Value Date   GBS Positive (A) 06/04/2024   Immunization History  Administered Date(s) Administered   Tdap 04/09/2024   Prenatal Transfer Tool  Maternal Diabetes: No Genetic Screening: Normal Maternal Ultrasounds/Referrals: Normal Fetal Ultrasounds or other Referrals:  None Maternal Substance Abuse:  No Significant Maternal Medications:  None Significant Maternal Lab Results: Group B Strep positive Number of Prenatal Visits:greater than 3 verified prenatal visits Maternal  Vaccinations:TDap Other Comments:  None  Results for orders placed or performed during the hospital encounter of 06/18/24 (from the past 24 hours)  Type and screen [facility]   Collection Time: 06/18/24 12:11 PM  Result Value Ref Range   ABO/RH(D) A POS    Antibody Screen NEG    Sample Expiration      06/21/2024,2359 Performed at [facility]  [facility], [address] [place], [place] [address]   CBC with Differential/Platelet   Collection Time: 06/18/24 12:14 PM  Result Value Ref Range   WBC 10.3 4.0 - 10.5 K/uL   RBC 3.86 (L) 3.87 - 5.11 MIL/uL   Hemoglobin 10.2 (L) 12.0 - 15.0 g/dL   HCT 68.3 (L) 63.9 - 53.9 %   MCV 81.9 80.0 - 100.0 fL   MCH 26.4 26.0 - 34.0 pg   MCHC 32.3 30.0 - 36.0 g/dL   RDW 84.2 (H) 88.4 - 84.4 %   Platelets 197 150 - 400 K/uL   nRBC 0.2 0.0 - 0.2 %   Neutrophils Relative % 69 %   Neutro Abs 7.2 1.7 - 7.7 K/uL   Lymphocytes Relative 21 %   Lymphs Abs 2.2 0.7 - 4.0 K/uL   Monocytes Relative 7 %   Monocytes Absolute 0.7 0.1 - 1.0 K/uL   Eosinophils Relative 1 %   Eosinophils Absolute 0.1 0.0 - 0.5 K/uL   Basophils Relative 1 %   Basophils Absolute 0.1 0.0 - 0.1 K/uL   Immature Granulocytes 1 %   Abs Immature Granulocytes 0.11 (H) 0.00 - 0.07 K/uL  Comprehensive metabolic panel   Collection Time: 06/18/24 12:14 PM  Result Value Ref Range   Sodium 136 135 - 145 mmol/L   Potassium 3.6 3.5 - 5.1 mmol/L   Chloride 106 98 - 111 mmol/L   CO2 19 (L) 22 - 32 mmol/L   Glucose, Bld 106 (H) 70 - 99 mg/dL   BUN 5 (L) 6 - 20 mg/dL   Creatinine, Ser 9.35 0.44 - 1.00 mg/dL   Calcium 8.6 (L) 8.9 - 10.3 mg/dL   Total Protein 5.8 (L) 6.5 - 8.1 g/dL   Albumin 2.7 (L) 3.5 - 5.0 g/dL   AST 22 15 - 41 U/L   ALT 14 0 - 44 U/L   Alkaline Phosphatase 174 (H) 38 - 126 U/L   Total Bilirubin 0.5 0.0 - 1.2 mg/dL   GFR, Estimated >39 >39 mL/min   Anion gap 11 5 - 15  Protein / creatinine ratio, urine   Collection Time: 06/18/24  1:17 PM  Result Value  Ref Range   Creatinine, Urine 213 mg/dL   Total Protein, Urine 75 mg/dL   Protein Creatinine Ratio 0.35 (H) 0.00 - 0.15 mg/mg[Cre]   Patient Active Problem List   Diagnosis Date Noted   Elevated blood pressure affecting pregnancy in third trimester, antepartum 06/18/2024   Preeclampsia 06/18/2024   Term pregnancy 06/18/2024   Group beta Strep positive 06/08/2024   Encounter for genetic screening 02/27/2024   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 G1P0 at [redacted]w[redacted]d here for IOL gHTN r/o preE.   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

## 2024-06-18 NOTE — Progress Notes (Signed)
 Patient ID: Kara Short, female   DOB: 05-Mar-2006, 18 y.o.   MRN: [id number]   Subjective: -Care assumed of 18 y.o. G1P0 at [redacted]w[redacted]d who presents for IOL s/t PreE. In room to meet acquaintance of patient and family (sister and friend).  Patient reports discomfort with contractions and request IV pain medication. She denies HA, visual disturbances, or RUQ pain.   Objective: BP 137/85   Pulse 81   Temp 98.7 F (37.1 C) (Axillary)   Resp 17   Ht 5' 1 (1.549 m)   Wt 73.8 kg   LMP 09/25/2023 (Approximate)   SpO2 100%   BMI 30.74 kg/m  No intake/output data recorded. No intake/output data recorded.  Fetal Monitoring: FHT: 145 bpm, Mod Var, -Decels, +Accels UC: Irritability    Physical Exam: General appearance: alert, well appearing, and in no distress. Chest: normal rate and regular rhythm.   Abdominal exam: Gravid, Soft RT, Mild Ctx. Extremities: No Edema Skin exam: Warm Dry  Vaginal Exam: SVE:   Dilation: 1 Effacement (%): 50 Station: -3 Exam by:: Kara Short, CNM Membranes:Intact Internal Monitors: None  Augmentation/Induction: Pitocin:Initiated Cytotec: S/P Dual Dose Foley Bulb inserted and instilled with 60cc  Assessment:  IUP at 39.1 weeks Cat I FT  PreEclampsia  Plan: -Discussed foley bulb insertion including r/b, placement, associated discomfort, initiation of pitocin, and removal. -Patient agreeable and without questions. Inserted without difficulty. -Reviewed pain management. -Start pitocin at 1mUN/min and increase to 4mUn/min. Hold until foley bulb expelled.  -Continue other mgmt as ordered.   Kara Short [name],MSN, CNM 06/18/2024, 10:28 PM

## 2024-06-18 NOTE — Progress Notes (Signed)
  [facility] PRENATAL VISIT NOTE- [facility]  Subjective:  Kara Short is a 18 y.o. G1P0 at [redacted]w[redacted]d being seen today for ongoing prenatal care through [facility].  She is currently monitored for the following issues for this low-risk pregnancy and has Encounter for supervision of normal first pregnancy in third trimester; Carrier of spinal muscular atrophy; Chlamydia trachomatis infection in pregnancy in first trimester; Encounter for genetic screening; Group beta Strep positive; and Elevated blood pressure affecting pregnancy in third trimester, antepartum on their problem list.  Patient reports no symptoms of preeclampsia.   Denies leaking of fluid/ROM.   The following portions of the patient's history were reviewed and updated as appropriate: allergies, current medications, past family history, past medical history, past social history, past surgical history and problem list. Problem list updated.  Objective:   Vitals:   06/18/24 0912 06/18/24 1052  BP: (!) 138/92 (!) 151/101  Pulse: 97 80     Fetal Status: Fetal Heart Rate (bpm): 152 Fundal Height: 38 cm    Presentation: Vertex  General:  Alert, oriented and cooperative. Patient is in no acute distress.  Skin: Skin is warm and dry. No rash noted.   Cardiovascular: Normal heart rate noted  Respiratory: Normal respiratory effort, no problems with respiration noted  Abdomen: Soft, gravid, appropriate for gestational age.        Pelvic: Cervical exam deferred        Extremities: Normal range of motion.     Mental Status: Normal mood and affect. Normal behavior. Normal judgment and thought content.   Assessment and Plan:  Pregnancy: G1P0 at [redacted]w[redacted]d  1. Group beta Strep positive (Primary) - Will need antibiotics in pregnancy  2. Encounter for supervision of normal first pregnancy in third trimester - See action items for #3  3. Elevated blood pressure affecting pregnancy in third  trimester, antepartum - Sent to MAU for labs and evaluation due to gestational age and last reading.   [facility], Session #10: Reviewed resources in mother's notebook regarding preparing for baby.     Facilitated discussion today:  Activities:  1. Stages of Labor  2.  Role Play - Postpartum .  Also reviewed importance of RSV/Flu, choosing pediatrician and family planning options.    Fundal height and FHR appropriate today unless noted otherwise in plan.   Go to MAU for evaluation.SABRA Lani LOISE Teresia, CNM

## 2024-06-19 ENCOUNTER — Inpatient Hospital Stay (HOSPITAL_COMMUNITY): Admitting: Anesthesiology

## 2024-06-19 DIAGNOSIS — O1404 Mild to moderate pre-eclampsia, complicating childbirth: Secondary | ICD-10-CM | POA: Diagnosis not present

## 2024-06-19 DIAGNOSIS — Z3A39 39 weeks gestation of pregnancy: Secondary | ICD-10-CM | POA: Diagnosis not present

## 2024-06-19 DIAGNOSIS — O99820 Streptococcus B carrier state complicating pregnancy: Secondary | ICD-10-CM | POA: Diagnosis not present

## 2024-06-19 LAB — CBC
HCT: 32.8 % — ABNORMAL LOW (ref 36.0–46.0)
Hemoglobin: 10.6 g/dL — ABNORMAL LOW (ref 12.0–15.0)
MCH: 26.4 pg (ref 26.0–34.0)
MCHC: 32.3 g/dL (ref 30.0–36.0)
MCV: 81.6 fL (ref 80.0–100.0)
Platelets: 207 K/uL (ref 150–400)
RBC: 4.02 MIL/uL (ref 3.87–5.11)
RDW: 15.6 % — ABNORMAL HIGH (ref 11.5–15.5)
WBC: 10.9 K/uL — ABNORMAL HIGH (ref 4.0–10.5)
nRBC: 0 % (ref 0.0–0.2)

## 2024-06-19 LAB — RPR: RPR Ser Ql: NONREACTIVE

## 2024-06-19 MED ORDER — SENNOSIDES-DOCUSATE SODIUM 8.6-50 MG PO TABS
2.0000 | ORAL_TABLET | Freq: Every day | ORAL | Status: DC
  Administered 2024-06-20 – 2024-06-21 (×2): 2 via ORAL
  Filled 2024-06-19 (×2): qty 2

## 2024-06-19 MED ORDER — NIFEDIPINE 10 MG PO CAPS: 10.0000 mg | ORAL_CAPSULE | ORAL | Status: DC | PRN

## 2024-06-19 MED ORDER — NIFEDIPINE 10 MG PO CAPS: 20.0000 mg | ORAL_CAPSULE | ORAL | Status: DC | PRN

## 2024-06-19 MED ORDER — NIFEDIPINE ER OSMOTIC RELEASE 30 MG PO TB24: 30.0000 mg | ORAL_TABLET | Freq: Every day | ORAL | Status: DC

## 2024-06-19 MED ORDER — BENZOCAINE-MENTHOL 20-0.5 % EX AERO
1.0000 | INHALATION_SPRAY | CUTANEOUS | Status: DC | PRN
  Administered 2024-06-20: 1 via TOPICAL
  Filled 2024-06-19: qty 56

## 2024-06-19 MED ORDER — ONDANSETRON HCL 4 MG/2ML IJ SOLN: 4.0000 mg | INTRAMUSCULAR | Status: DC | PRN

## 2024-06-19 MED ORDER — ONDANSETRON HCL 4 MG PO TABS: 4.0000 mg | ORAL_TABLET | ORAL | Status: DC | PRN

## 2024-06-19 MED ORDER — NIFEDIPINE ER OSMOTIC RELEASE 30 MG PO TB24
30.0000 mg | ORAL_TABLET | Freq: Every day | ORAL | Status: DC
  Administered 2024-06-19: 30 mg via ORAL
  Filled 2024-06-19: qty 1

## 2024-06-19 MED ORDER — ZOLPIDEM TARTRATE 5 MG PO TABS: 5.0000 mg | ORAL_TABLET | Freq: Every evening | ORAL | Status: DC | PRN

## 2024-06-19 MED ORDER — COCONUT OIL OIL
1.0000 | TOPICAL_OIL | Status: DC | PRN
  Administered 2024-06-20: 1 via TOPICAL

## 2024-06-19 MED ORDER — IBUPROFEN 600 MG PO TABS
600.0000 mg | ORAL_TABLET | Freq: Four times a day (QID) | ORAL | Status: DC
  Administered 2024-06-20 – 2024-06-21 (×5): 600 mg via ORAL
  Filled 2024-06-19 (×6): qty 1

## 2024-06-19 MED ORDER — FENTANYL CITRATE (PF) 100 MCG/2ML IJ SOLN
INTRAMUSCULAR | Status: AC
  Filled 2024-06-19: qty 2

## 2024-06-19 MED ORDER — PRENATAL MULTIVITAMIN CH
1.0000 | ORAL_TABLET | Freq: Every day | ORAL | Status: DC
  Administered 2024-06-21: 1 via ORAL
  Filled 2024-06-19: qty 1

## 2024-06-19 MED ORDER — DIPHENHYDRAMINE HCL 25 MG PO CAPS: 25.0000 mg | ORAL_CAPSULE | Freq: Four times a day (QID) | ORAL | Status: DC | PRN

## 2024-06-19 MED ORDER — BUPIVACAINE HCL (PF) 0.25 % IJ SOLN
INTRAMUSCULAR | Status: DC | PRN
  Administered 2024-06-19 (×2): 8 mL via EPIDURAL

## 2024-06-19 MED ORDER — POTASSIUM CHLORIDE CRYS ER 20 MEQ PO TBCR
20.0000 meq | EXTENDED_RELEASE_TABLET | Freq: Two times a day (BID) | ORAL | Status: DC
  Administered 2024-06-19 – 2024-06-21 (×4): 20 meq via ORAL
  Filled 2024-06-19 (×4): qty 1

## 2024-06-19 MED ORDER — LIDOCAINE HCL (PF) 1 % IJ SOLN
INTRAMUSCULAR | Status: DC | PRN
  Administered 2024-06-19: 3 mL via SUBCUTANEOUS

## 2024-06-19 MED ORDER — SIMETHICONE 80 MG PO CHEW: 80.0000 mg | CHEWABLE_TABLET | ORAL | Status: DC | PRN

## 2024-06-19 MED ORDER — LIDOCAINE-EPINEPHRINE (PF) 2 %-1:200000 IJ SOLN
INTRAMUSCULAR | Status: DC | PRN
  Administered 2024-06-19: 3 mL via EPIDURAL

## 2024-06-19 MED ORDER — ACETAMINOPHEN 325 MG PO TABS: 650.0000 mg | ORAL_TABLET | ORAL | Status: DC | PRN

## 2024-06-19 MED ORDER — FENTANYL CITRATE (PF) 100 MCG/2ML IJ SOLN
INTRAMUSCULAR | Status: DC | PRN
  Administered 2024-06-19 (×2): 100 ug via EPIDURAL

## 2024-06-19 MED ORDER — DIBUCAINE (PERIANAL) 1 % EX OINT: 1.0000 | TOPICAL_OINTMENT | CUTANEOUS | Status: DC | PRN

## 2024-06-19 MED ORDER — SODIUM CHLORIDE 0.9 % IV SOLN
INTRAVENOUS | Status: DC | PRN
  Administered 2024-06-19: 5 mL via EPIDURAL

## 2024-06-19 MED ORDER — LABETALOL HCL 5 MG/ML IV SOLN: 40.0000 mg | INTRAVENOUS | Status: DC | PRN

## 2024-06-19 MED ORDER — NIFEDIPINE 10 MG PO CAPS: 10.0000 mg | ORAL_CAPSULE | Freq: Once | ORAL | Status: DC

## 2024-06-19 MED ORDER — WITCH HAZEL-GLYCERIN EX PADS: 1.0000 | MEDICATED_PAD | CUTANEOUS | Status: DC | PRN

## 2024-06-19 MED ORDER — FUROSEMIDE 20 MG PO TABS
20.0000 mg | ORAL_TABLET | Freq: Every day | ORAL | Status: DC
  Administered 2024-06-20 – 2024-06-21 (×2): 20 mg via ORAL
  Filled 2024-06-19 (×2): qty 1

## 2024-06-19 MED ORDER — TETANUS-DIPHTH-ACELL PERTUSSIS 5-2-15.5 LF-MCG/0.5 IM SUSP: 0.5000 mL | Freq: Once | INTRAMUSCULAR | Status: DC

## 2024-06-19 NOTE — Progress Notes (Signed)
 Kara Short is a 18 y.o. G1P0 at [redacted]w[redacted]d by LMP admitted for induction of labor due to Preeclampsia, P/C ratio 0.35.  Subjective: Pt comfortable with epidural. Family in room for support.  Pt reports anxiety about elevated BP, and about side effects of epidural.   Objective: BP 121/75   Pulse 92   Temp 97.7 F (36.5 C) (Axillary)   Resp 18   Ht 5' 1 (1.549 m)   Wt 73.8 kg   LMP 09/25/2023 (Approximate)   SpO2 100%   BMI 30.74 kg/m  No intake/output data recorded. No intake/output data recorded.  FHT:  FHR: 135 bpm, variability: moderate,  accelerations:  Present,  decelerations:  Present recurrent variables UC:   irregular, every 2-5 minutes SVE:   3.5/thick/-2 vertex Exam by Harlene Duncans, CNM, at 0730  Labs: Lab Results  Component Value Date   WBC 10.9 (H) 06/19/2024   HGB 10.6 (L) 06/19/2024   HCT 32.8 (L) 06/19/2024   MCV 81.6 06/19/2024   PLT 207 06/19/2024    Assessment / Plan: Induction of labor due to preeclampsia,  progressing well on pitocin  Labor: Progressing normally Preeclampsia:  labs stable BP 150s/70s so PO Procardia started, consult Dr Cleatus. Watch BP, start magnesium if severe range.  Fetal Wellbeing:  Category II Pain Control:  Epidural I/D:  GBS positive on PCN Anticipated MOD:  NSVD  Olam Boards, CNM 06/19/2024, 10:15 AM

## 2024-06-19 NOTE — Progress Notes (Signed)
 Patient ID: Kara Short, female   DOB: Aug 07, 2006, 18 y.o.   MRN: [id number]  Subjective: -Patient resting in bed. Reports some anxiety, but contributes to lack of sleep.  Endorses comfort.  Family remains at bedside, supportive.   Objective: BP (!) 154/72   Pulse (!) 108   Temp 98.7 F (37.1 C) (Oral)   Resp 18   Ht 5' 1 (1.549 m)   Wt 73.8 kg   LMP 09/25/2023 (Approximate)   SpO2 100%   BMI 30.74 kg/m  No intake/output data recorded. No intake/output data recorded.  Fetal Monitoring: FHT: 140 bpm, Mod Var, -Decels, +Accels UC: Q2-82min    Vaginal Exam: SVE:   Dilation: 3.5 Effacement (%): Thick Station: -2 Exam by:: Harlene Duncans CNM Membranes: AROM with moderate amt clear fluid.  Internal Monitors: None  Augmentation/Induction: Pitocin:52mUn/min Cytotec: S/P 50mcg at 1826  Assessment:  IUP at 39.1 weeks Cat I FT  Amniotomy IOL Anxiety  Plan: -Discussed AROM r/b including increased risk of infection, cord prolapse, fetal intolerance, and decreased labor time. No questions or concerns and patient desires to proceed with AROM.  -Discussed feelings of anxiety and lack of sleep.  Offered sleep aide and/or anxiety medications. -Patient declines currently. -Continue other mgmt as ordered.   Harlene LITTIE Duncans LAFE, CNM Advanced Practice Provider, [facility] 06/19/2024, 7:55 AM

## 2024-06-19 NOTE — Progress Notes (Signed)
 Patient ID: Jeraldean Wechter, female   DOB: 05-Apr-2006, 18 y.o.   MRN: [id number]  Subjective: Nurse reports foley bulb expelled around 0200 and epidural placed.  Exam as below. Strip and Chart Reviewed.  Objective:  Vitals:   06/19/24 0156 06/19/24 0201 06/19/24 0230 06/19/24 0300  BP: 135/62 (!) 139/100 (!) 131/112 (!) 124/96  Pulse: (!) 115 98  (!) 126  Resp:      Temp:      TempSrc:      SpO2:      Weight:      Height:        FHR: 135 bpm, Mod Var, -Decels, +Accels UC: Irritability   Dilation: 3.5 Effacement (%): Thick Cervical Position: Posterior Station: -2 Presentation: Vertex Exam by:: Gillen, RN  Assessment: IUP at [redacted]w[redacted]d Cat I FT IOL  PreEclampsia  Plan: -Continue pitocin titration as appropriate -Will consider AROM and IUPC insertion at next check.  DOROTHA Duncans, CNM 06/19/2024 4:04 AM

## 2024-06-19 NOTE — Progress Notes (Addendum)
 The Rn called Kara Short Midwife regarding the patient's increased BPs on admission to the mother baby units. Procardia was ordered.

## 2024-06-19 NOTE — Anesthesia Preprocedure Evaluation (Signed)
 Anesthesia Evaluation  Patient identified by MRN, date of birth, ID band Patient awake    Reviewed: Allergy & Precautions, NPO status , Patient's Chart, lab work & pertinent test results  History of Anesthesia Complications Negative for: history of anesthetic complications  Airway Mallampati: II  TM Distance: >3 FB Neck ROM: Full    Dental no notable dental hx. (+) Teeth Intact   Pulmonary neg pulmonary ROS, neg sleep apnea, neg COPD, Patient abstained from smoking.Not current smoker   Pulmonary exam normal breath sounds clear to auscultation       Cardiovascular Exercise Tolerance: Good METShypertension, (-) CAD and (-) Past MI (-) dysrhythmias  Rhythm:Regular Rate:Normal - Systolic murmurs    Neuro/Psych negative neurological ROS  negative psych ROS   GI/Hepatic ,neg GERD  ,,(+)     (-) substance abuse    Endo/Other  neg diabetes    Renal/GU negative Renal ROS     Musculoskeletal   Abdominal   Peds  Hematology Denies blood thinner use or bleeding disorders.    Anesthesia Other Findings Denies blood thinner use or bleeding diatheses. Recent labs reviewed. History reviewed. No pertinent past medical history.   Reproductive/Obstetrics (+) Pregnancy                              Anesthesia Physical Anesthesia Plan  ASA: 2  Anesthesia Plan: Epidural   Post-op Pain Management:    Induction:   PONV Risk Score and Plan: 2 and Treatment may vary due to age or medical condition and Ondansetron  Airway Management Planned: Natural Airway  Additional Equipment:   Intra-op Plan:   Post-operative Plan:   Informed Consent: I have reviewed the patients History and Physical, chart, labs and discussed the procedure including the risks, benefits and alternatives for the proposed anesthesia with the patient or authorized representative who has indicated his/her understanding and acceptance.        Plan Discussed with: Surgeon  Anesthesia Plan Comments: (Discussed R/B/A of neuraxial anesthesia technique with patient: - rare risks of spinal/epidural hematoma, nerve damage, infection - Risk of PDPH - Risk of itching - Risk of nausea and vomiting - Risk of poor block necessitating replacement of epidural. - Risk of allergic reactions. Patient voiced understanding.)        Anesthesia Quick Evaluation

## 2024-06-19 NOTE — Discharge Summary (Signed)
 Postpartum Discharge Summary  Date of Service updated***     Patient Name: Kara Short DOB: 23-Jan-2006 MRN: [id number]  Date of admission: 06/18/2024 Delivery date:06/19/2024 Delivering provider: TERESIA LANI SAILOR Date of discharge: 06/19/2024  Admitting diagnosis: Term pregnancy [Z34.90] Intrauterine pregnancy: [redacted]w[redacted]d     Secondary diagnosis:  Principal Problem:   Term pregnancy Active Problems:   Preeclampsia  Additional problems: *** None   Discharge diagnosis: Term Pregnancy Delivered                                              Post partum procedures:{Postpartum procedures:23558} Augmentation: AROM, Pitocin, Cytotec, and IP Foley Complications: None  Hospital course: Induction of Labor With Vaginal Delivery   18 y.o. yo G1P0 at [redacted]w[redacted]d was admitted to the hospital 06/18/2024 for induction of labor.  Indication for induction: preeclampsia without severe features.  Patient had an uncomplicated labor course. Membrane Rupture Time/Date: 7:35 AM,06/19/2024  Delivery Method:Vaginal, Spontaneous Operative Delivery:N/A Episiotomy: None Lacerations:  2nd degree Details of delivery can be found in separate delivery note.  Patient had a postpartum course complicated by***. Patient is discharged home 06/19/24.  Newborn Data: Birth date:06/19/2024 Birth time:6:51 PM Gender:Female Living status:Living Apgars:9 ,9  Weight:   Magnesium Sulfate received: No BMZ received: No Rhophylac:No MMR:No T-DaP:Given prenatally Flu: No RSV Vaccine received: No Transfusion:No  Immunizations received: Immunization History  Administered Date(s) Administered   Tdap 04/09/2024    Physical exam  Vitals:   06/19/24 1630 06/19/24 1801 06/19/24 1817 06/19/24 1830  BP: (!) 142/88 (!) 141/91 (!) 146/91 (!) 146/90  Pulse: (!) 105 100 (!) 109 97  Resp: 18     Temp: 98.9 F (37.2 C)     TempSrc: Axillary     SpO2:      Weight:      Height:       General: {Exam;  general:21111117} Lochia: {Desc; appropriate/inappropriate:30686::appropriate} Uterine Fundus: {Desc; firm/soft:30687} Incision: {Exam; incision:21111123} DVT Evaluation: {Exam; dvt:2111122} Labs: Lab Results  Component Value Date   WBC 10.9 (H) 06/19/2024   HGB 10.6 (L) 06/19/2024   HCT 32.8 (L) 06/19/2024   MCV 81.6 06/19/2024   PLT 207 06/19/2024      Latest Ref Rng & Units 06/18/2024   12:14 PM  CMP  Glucose 70 - 99 mg/dL 893   BUN 6 - 20 mg/dL 5   Creatinine 9.55 - 8.99 mg/dL 9.35   Sodium 864 - 854 mmol/L 136   Potassium 3.5 - 5.1 mmol/L 3.6   Chloride 98 - 111 mmol/L 106   CO2 22 - 32 mmol/L 19   Calcium 8.9 - 10.3 mg/dL 8.6   Total Protein 6.5 - 8.1 g/dL 5.8   Total Bilirubin 0.0 - 1.2 mg/dL 0.5   Alkaline Phos 38 - 126 U/L 174   AST 15 - 41 U/L 22   ALT 0 - 44 U/L 14    Edinburgh Score:     No data to display         No data recorded  After visit meds:  Allergies as of 06/19/2024   No Known Allergies   Med Rec must be completed prior to using this [facility]***        Discharge home in stable condition Infant Feeding: {Baby feeding:23562} Infant Disposition:{CHL IP OB HOME WITH FNUYZM:76418} Discharge instruction: per After Visit Summary and Postpartum booklet.  Activity: Advance as tolerated. Pelvic rest for 6 weeks.  Diet: {OB ipzu:[id number]} Future Appointments:No future appointments. Follow up Visit:  Message sent to [facility]:  Please schedule this patient for a In person postpartum visit in 6 weeks with the following provider: midwife or Dr Eldonna (Centering patient). Additional Postpartum F/U:BP check 1 week  High risk pregnancy complicated by: preeclampsia Delivery mode:  Vaginal, Spontaneous Anticipated Birth Control:  undecided   06/19/2024 Olam Boards, CNM

## 2024-06-19 NOTE — Anesthesia Procedure Notes (Signed)
 Epidural Patient location during procedure: OB Start time: 06/19/2024 1:13 AM End time: 06/19/2024 1:19 AM  Staffing Anesthesiologist: Boone Fess, MD Performed: anesthesiologist   Preanesthetic Checklist Completed: patient identified, IV checked, site marked, risks and benefits discussed, surgical consent, monitors and equipment checked, pre-op evaluation and timeout performed  Epidural Patient position: sitting Prep: ChloraPrep Patient monitoring: heart rate, continuous pulse ox and blood pressure Approach: midline Location: L3-L4 Injection technique: LOR saline  Needle:  Needle type: Tuohy  Needle gauge: 17 G Needle length: 9 cm Needle insertion depth: 6 cm Catheter type: closed end flexible Catheter size: 19 Gauge Catheter at skin depth: 11 cm Test dose: negative and 1.5% lidocaine with Epi 1:200 K  Assessment Sensory level: T10 Events: blood not aspirated, no cerebrospinal fluid, injection not painful, no injection resistance, no paresthesia and negative IV test  Additional Notes First/one attempt Pt. Evaluated and documentation done after procedure finished. Patient identified. Risks/Benefits/Options discussed with patient including but not limited to bleeding, infection, nerve damage, paralysis, failed block, incomplete pain control, headache, blood pressure changes, nausea, vomiting, reactions to medication both or allergic, itching and postpartum back pain. Confirmed with bedside nurse the patient's most recent platelet count. Confirmed with patient that they are not currently taking any anticoagulation, have any bleeding history or any family history of bleeding disorders. Patient expressed understanding and wished to proceed. All questions were answered. Sterile technique was used throughout the entire procedure. Please see nursing notes for vital signs. Test dose was given through epidural catheter and negative prior to continuing to dose epidural or start infusion.  Warning signs of high block given to the patient including shortness of breath, tingling/numbness in hands, complete motor block, or any concerning symptoms with instructions to call for help. Patient was given instructions on fall risk and not to get out of bed. All questions and concerns addressed with instructions to call with any issues or inadequate analgesia.     Patient tolerated the insertion well without immediate complications.  Reason for block: procedure for painReason for block:procedure for pain

## 2024-06-19 NOTE — Progress Notes (Signed)
 Flor Whitacre is a 18 y.o. G1P0 at [redacted]w[redacted]d admitted for induction of labor due to Preeclampsia, P/C ratio 0.35   Subjective: Shamaya now sleeping after getting additional medication in epidural.  Pain now reported as 0/10.  Pain prior to that was reported as a 10/10.    Objective: BP 128/83   Pulse 95   Temp 98.9 F (37.2 C) (Axillary)   Resp 18   Ht 5' 1 (1.549 m)   Wt 73.8 kg   LMP 09/25/2023 (Approximate)   SpO2 100%   BMI 30.74 kg/m    FHT:  FHR: 125 bpm, variability: moderate,  accelerations:  present,  decelerations:  present, intermittent variables. UC:   Q 1.5 - 3 minutes Dilation: 7 Effacement (%): 70 Cervical Position: Posterior Station: -2, -1 Presentation: Vertex Exam by:: Wyvonna Lemmings RN  Labs: Results for orders placed or performed during the hospital encounter of 06/18/24 (from the past 24 hours)  CBC     Status: Abnormal   Collection Time: 06/19/24 12:09 AM  Result Value Ref Range   WBC 10.9 (H) 4.0 - 10.5 K/uL   RBC 4.02 3.87 - 5.11 MIL/uL   Hemoglobin 10.6 (L) 12.0 - 15.0 g/dL   HCT 67.1 (L) 63.9 - 53.9 %   MCV 81.6 80.0 - 100.0 fL   MCH 26.4 26.0 - 34.0 pg   MCHC 32.3 30.0 - 36.0 g/dL   RDW 84.3 (H) 88.4 - 84.4 %   Platelets 207 150 - 400 K/uL   nRBC 0.0 0.0 - 0.2 %    Assessment / Plan: [redacted]w[redacted]d week IUP; IOL due to preeclampsia. ROM x Hours: 6 Minutes: 53 Labor: Progressing normally Fetal Wellbeing:  Category Category II Pain Control:  Epidural I/D:  GBS pos on PCN Anticipated MOD:  NSVD  Teresia Lani SAILOR, CNM 06/19/2024 2:28 PM

## 2024-06-20 MED ORDER — NIFEDIPINE ER OSMOTIC RELEASE 30 MG PO TB24
30.0000 mg | ORAL_TABLET | Freq: Two times a day (BID) | ORAL | Status: DC
  Administered 2024-06-20 – 2024-06-21 (×3): 30 mg via ORAL
  Filled 2024-06-20 (×3): qty 1

## 2024-06-20 NOTE — Progress Notes (Signed)
 CSW was consulted due to anxiety and depression during pregnancy and strained relationship with FOB. CSW attempted to meet with MOB to complete assessment; however, when CSW entered the room, several visitors were present. MOB requested CSW return at a a later time. CSW to follow up.   Signed,  Sharyne LOIS Roulette, MSW, LCSWA, LCASA 06/20/2024 2:55 PM

## 2024-06-20 NOTE — Lactation Note (Addendum)
 This note was copied from a baby's chart. Lactation Consultation Note  Patient Name: Kara Short Date: 06/20/2024 Age:20 hours Reason for consult: Initial assessment;Primapara;Term  P1. Attempted to get baby latched to breast but he wasn't very interested in feeding. He would latch and only suckle once or twice then stop needing much stimulation to suckle again. He fell asleep and would suckle at all. Attempted hand expression w/no colostrum noted at this time.  Newborn feeding habits, behavior, STS, I&O, positioning, body alignment reviewed. Mom encouraged to feed baby 8-12 times/24 hours and with feeding cues. Wake baby if hasn't cued in 3 hrs to feed. Encourage mom to call for assistance as needed. Mom doesn't have DEBP at home. [facility] sent referral for Stork pump. Maternal Data Has patient been taught Hand Expression?: Yes Does the patient have breastfeeding experience prior to this delivery?: No  Feeding    LATCH Score Latch: Too sleepy or reluctant, no latch achieved, no sucking elicited.  Audible Swallowing: None  Type of Nipple: Everted at rest and after stimulation  Comfort (Breast/Nipple): Soft / non-tender  Hold (Positioning): Assistance needed to correctly position infant at breast and maintain latch.  LATCH Score: 5   Lactation Tools Discussed/Used    Interventions Interventions: Breast feeding basics reviewed;Assisted with latch;Skin to skin;Breast massage;Hand express;Breast compression;Adjust position;Support pillows;Position options;Education;LC Services brochure  Discharge Discharge Education: Outpatient recommendation Pump: Referral sent for [facility];Manual [facility]: Yes  Consult Status Consult Status: Follow-up Date: 06/20/24 Follow-up type: In-patient    Mindee Robledo G 06/20/2024, 1:29 AM

## 2024-06-20 NOTE — Progress Notes (Signed)
 Per MD call if patient's blood pressure is 150/100 or greater.

## 2024-06-20 NOTE — Progress Notes (Signed)
 Kara Short had been previous taken out by another nurse ,but they did not chart that they had taken the foley out. Writer just charted that the foley was out.

## 2024-06-20 NOTE — Progress Notes (Addendum)
 POSTPARTUM PROGRESS NOTE  Post Partum Day 1  Subjective:  Kara Short is a 18 y.o. G1P0 s/p SVD at [redacted]w[redacted]d.  She reports she is doing well. No acute events overnight. She denies any problems with ambulating, voiding or po intake. Denies nausea or vomiting.  Pain is well controlled.  Lochia is Normal. Blood pressures have been consistently elevated.   No headache, blurry vision, RUQ pain, severe LE edema,   Objective: Blood pressure (!) 142/95, pulse 90, temperature 98 F (36.7 C), temperature source Oral, resp. rate 18, height 5' 1 (1.549 m), weight 73.8 kg, last menstrual period 09/25/2023, SpO2 100%.  BP Readings from Last 3 Encounters:  06/20/24 (!) 142/95  06/18/24 (!) 151/101  06/04/24 127/81    Physical Exam:  General: alert, cooperative and no distress Chest: no respiratory distress Heart:regular rate, distal pulses intact Uterine Fundus: firm, appropriately tender DVT Evaluation: No calf swelling or tenderness Extremities: 1+ and R>L edema. Negative Homan's sign Skin: warm, dry  Recent Labs    06/18/24 1214 06/19/24 0009  HGB 10.2* 10.6*  HCT 31.6* 32.8*    Assessment/Plan: Kara Short is a 18 y.o. G1P0 s/p NSVD at [redacted]w[redacted]d   PPD# 1 - Doing well  Routine postpartum care  Delivery Complications: preeclampsia/eclampsia  Blood Pressure: abnormal: elevated, start Lasix/KCl & procardia 30mg . BabyRx ordered Anemia/Hb Status: appropriate admission Hb Feeding: breast feeding   Dispo: Plan for discharge tomorrow pending BP control.   LOS: 2 days   Leeroy KATHEE Pouch, MD OB Fellow  06/20/2024, 4:45 AM

## 2024-06-20 NOTE — Plan of Care (Signed)
   Problem: Education: Goal: Knowledge of General Education information will improve Description: Including pain rating scale, medication(s)/side effects and non-pharmacologic comfort measures Outcome: Completed/Met

## 2024-06-20 NOTE — Lactation Note (Signed)
 This note was copied from a baby's chart. Lactation Consultation Note  Patient Name: Kara Short Unijb'd Date: 06/20/2024 Age:20 hours Reason for consult: Follow-up assessment;RN request;Mother's request;Primapara;Term  P1. RN and mom requested LC d/t baby not interested in feeding over 24 hrs old. Baby just recently had another void. Mom stated he voided before LC went into rm and she changed him. LC just changed another diaper. Baby is sucking on his finger and pacifier but will not hardly open his mouth much even when crying. Makes for difficulty latching. Mom wanted baby to Rt. Breast because she stated she has more milk in that breast. Rt. Breast has some areola edema. LC done reverse pressure to soften breast tissue and LC finally got baby to latch and LC held breast tissue in baby's mouth while he suckled and breast tissue softened significantly. Got mom to feel difference.  Baby tongue thrusting a lot. Baby can't maintain latch unless breast tissue held in his mouth. Gave baby formula after feeding to breast. Needed purple nipple baby took 20ml.  Mom received her Stork pump today. Praised mom for that.  Asked RN Rexene to give mom shells to wear to reduce areola edema.  Maternal Data    Feeding Mother's Current Feeding Choice: Breast Milk and Formula Nipple Type: Nfant Slow Flow (purple)  LATCH Score Latch: Repeated attempts needed to sustain latch, nipple held in mouth throughout feeding, stimulation needed to elicit sucking reflex.  Audible Swallowing: A few with stimulation  Type of Nipple: Everted at rest and after stimulation (short shaft compressible/some edema)  Comfort (Breast/Nipple): Soft / non-tender  Hold (Positioning): Full assist, staff holds infant at breast  LATCH Score: 6   Lactation Tools Discussed/Used    Interventions Interventions: Breast feeding basics reviewed;Assisted with latch;Skin to skin;Breast massage;Hand express;Pre-pump if  needed;Reverse pressure;Breast compression;Adjust position;Support pillows;Position options;Shells;DEBP;Education;Pace feeding  Discharge Pump: Received Stork Pump  Consult Status Consult Status: Follow-up Date: 06/21/24 Follow-up type: In-patient    Abednego Yeates G 06/20/2024, 8:38 PM

## 2024-06-20 NOTE — Anesthesia Postprocedure Evaluation (Signed)
 Anesthesia Post Note  Patient: Kara Short  Procedure(s) Performed: AN AD HOC LABOR EPIDURAL     Patient location during evaluation: Mother Baby Anesthesia Type: Epidural Level of consciousness: awake and alert Pain management: pain level controlled Vital Signs Assessment: post-procedure vital signs reviewed and stable Respiratory status: spontaneous breathing, nonlabored ventilation and respiratory function stable Cardiovascular status: stable Postop Assessment: no headache, no backache, epidural receding, no apparent nausea or vomiting, patient able to bend at knees, able to ambulate and adequate PO intake Anesthetic complications: no   No notable events documented.  Last Vitals:  Vitals:   06/20/24 0101 06/20/24 0555  BP: (!) 142/95 (!) 134/93  Pulse: 90 100  Resp: 18 18  Temp: 36.7 C (!) 36.4 C  SpO2:  100%    Last Pain:  Vitals:   06/20/24 0555  TempSrc: Oral  PainSc:    Pain Goal:                   Kara Short

## 2024-06-21 MED ORDER — IBUPROFEN 600 MG PO TABS: 600.0000 mg | ORAL_TABLET | Freq: Four times a day (QID) | ORAL | 0 refills | Status: AC

## 2024-06-21 MED ORDER — FUROSEMIDE 20 MG PO TABS: 20.0000 mg | ORAL_TABLET | Freq: Every day | ORAL | 0 refills | Status: AC

## 2024-06-21 MED ORDER — POTASSIUM CHLORIDE CRYS ER 20 MEQ PO TBCR: 20.0000 meq | EXTENDED_RELEASE_TABLET | Freq: Every day | ORAL | 0 refills | Status: AC

## 2024-06-21 MED ORDER — ACETAMINOPHEN 325 MG PO TABS: 650.0000 mg | ORAL_TABLET | ORAL | Status: AC | PRN

## 2024-06-21 MED ORDER — SENNOSIDES-DOCUSATE SODIUM 8.6-50 MG PO TABS: 2.0000 | ORAL_TABLET | Freq: Every day | ORAL | 0 refills | Status: AC

## 2024-06-21 MED ORDER — NIFEDIPINE ER 30 MG PO TB24: 30.0000 mg | ORAL_TABLET | Freq: Two times a day (BID) | ORAL | 2 refills | Status: AC

## 2024-06-21 NOTE — Patient Instructions (Signed)
 If interested in an outpatient lactation consult in office or virtually please reach out to us  at [facility] (First Floor) [address] [place]  Please feel free to out with any lactation related questions or concerns [phone]  to leave a message for our lactation voicemail box.  Lactation support groups:  [facility], Tuesdays 10:00 am -12:00 pm at [address] on the second floor in the conference room, lactating parents and lap babies welcome.  [email or web address]  [email or web address]  Si est interesado en una consulta ambulatoria sobre lactancia en el consultorio o virtualmente, comunquese con nosotros al [facility]) [address] [place], [place] No dude en comunicarse con cualquier pregunta o inquietud relacionada con la lactancia al [phone] para dejar un mensaje en nuestro buzn de voz de lactancia  Grupos de apoyo para la lactancia:  [facility], martes de 10:00 a. m. a 12:00 p. m., en [address], segundo piso, sala de conferencias. Se admiten madres lactantes y bebs en regazo.      Delaney Mandril, [facility]

## 2024-06-21 NOTE — Social Work (Signed)
 CSW received consult for hx of Anxiety and Depression and noted strained relationship with FOB.  CSW met with MOB to offer support and complete assessment.  CSW entered the room and observed MOB resting in the bed, infant in the bassinet and MOB's sister at bedside. CSW introduced self, CSW role and reason for visit, MOB was agreeable visit and allowed her sister to remain in the room. CSW inquired about how MOB was feeling, MOB reported good. CSW inquired about MOB MH hx, MOB denied any MH concerns and reported she didn't feel like she experienced anxiety or depression during pregnancy. CSW inquired about past MH diagnosis, MOB denied past MH diagnosis and reported a stable mood. CSW inquired about relationship with FOB, MOB reported they are not speaking and he will no be involved at this time. MOB reported no other concerns around FOB not being involved. CSW provided education regarding the baby blues period vs. perinatal mood disorders, discussed treatment and gave resources for mental health follow up if concerns arise.  CSW recommends self-evaluation during the postpartum time period using the New Mom Checklist from Postpartum Progress and encouraged MOB to contact a medical professional if symptoms are noted at any time. MOB identified her sister and father as her primary supports.     CSW provided review of Sudden Infant Death Syndrome (SIDS) precautions.  MOB identified [facility] follow up care. MOB reported she has all essential items for the infant including a bassinet and car seat.  CSW identifies no further need for intervention and no barriers to discharge at this time.  Kara Short, LCSWA Clinical Social Worker [phone]

## 2024-06-22 LAB — BIRTH TISSUE RECOVERY COLLECTION (PLACENTA DONATION)

## 2024-06-29 ENCOUNTER — Ambulatory Visit

## 2024-06-29 ENCOUNTER — Other Ambulatory Visit: Payer: Self-pay

## 2024-06-29 VITALS — BP 155/98 | HR 89 | Ht 61.0 in | Wt 135.8 lb

## 2024-06-29 DIAGNOSIS — Z0130 Encounter for examination of blood pressure without abnormal findings: Secondary | ICD-10-CM

## 2024-06-29 NOTE — Progress Notes (Signed)
 Kara Short is here for a BP check. She delivered 06/19/24. Kara Short has a hx Pre-E w/o severe features. Patient states she didn't take the 4 day supply of potassium or lasix. Also, states she is not taking 30 mg Nifedipine. The patient denies headache, blurry vision, dizziness, or peripheral edema.  BP 155/98   Reviewed chart with provider. Patient advised to start taking medications as ordered (lasix, potassium & nifedipine) and follow-up in one week for BP check. Patient states she will begin taking all blood pressure medications today. I reviewed MAU precautions with the patient and she voiced understanding. Patient aware postpartum appointment is scheduled for 08/03/24. Patient states she has no further questions or concerns.   Devon, RN 06/29/24

## 2024-06-29 NOTE — Patient Instructions (Signed)

## 2024-07-06 ENCOUNTER — Ambulatory Visit (INDEPENDENT_AMBULATORY_CARE_PROVIDER_SITE_OTHER)

## 2024-07-06 ENCOUNTER — Other Ambulatory Visit: Payer: Self-pay

## 2024-07-06 VITALS — BP 132/89 | Wt 136.3 lb

## 2024-07-06 DIAGNOSIS — Z0130 Encounter for examination of blood pressure without abnormal findings: Secondary | ICD-10-CM

## 2024-07-06 NOTE — Progress Notes (Signed)
 Subjective:  Kara Short is a G1P0 here for BP check.  She is 2 weeks postpartum following a normal spontaneous vaginal delivery.    Hypertension ROS: Patient has headache   Objective:  BP 132/89   Wt 136 lb 5 oz (61.8 kg)   LMP 09/25/2023 (Approximate)   Breastfeeding No   BMI 25.76 kg/m   Appearance alert, well appearing, and in no distress.  Assessment:   Blood Pressure improved and asymptomatic.   Plan:  Current treatment plan is effective, no change in therapy. Orders and follow up as documented in patient record.Pt will follow up at her post partum visit on 08/03/2024. Pt denies any further questions or concerns at this time.    Cyndee JAYSON Molt, RN

## 2024-08-03 ENCOUNTER — Ambulatory Visit: Admitting: Certified Nurse Midwife

## 2024-08-04 ENCOUNTER — Encounter: Payer: Self-pay | Admitting: *Deleted
